# Patient Record
Sex: Female | Born: 1970 | Race: Black or African American | Hispanic: No | Marital: Married | Smoking: Never smoker
Health system: Southern US, Community
[De-identification: ages and names within clinical notes are randomized; demographics above are authoritative.]

## PROBLEM LIST (undated history)

## (undated) DIAGNOSIS — Z789 Other specified health status: Secondary | ICD-10-CM

## (undated) DIAGNOSIS — D649 Anemia, unspecified: Secondary | ICD-10-CM

## (undated) DIAGNOSIS — E669 Obesity, unspecified: Secondary | ICD-10-CM

## (undated) DIAGNOSIS — E119 Type 2 diabetes mellitus without complications: Secondary | ICD-10-CM

## (undated) DIAGNOSIS — E785 Hyperlipidemia, unspecified: Secondary | ICD-10-CM

## (undated) HISTORY — DX: Type 2 diabetes mellitus without complications: E11.9

## (undated) HISTORY — DX: Obesity, unspecified: E66.9

## (undated) HISTORY — DX: Hyperlipidemia, unspecified: E78.5

## (undated) HISTORY — PX: BREAST CYST ASPIRATION: SHX578

---

## 2009-11-12 ENCOUNTER — Emergency Department: Payer: Self-pay | Admitting: Emergency Medicine

## 2010-02-08 ENCOUNTER — Ambulatory Visit: Payer: Self-pay | Admitting: Family Medicine

## 2010-02-08 LAB — HM MAMMOGRAPHY: HM MAMMO: NORMAL

## 2010-02-10 ENCOUNTER — Ambulatory Visit: Payer: Self-pay | Admitting: Family Medicine

## 2010-05-29 DIAGNOSIS — D649 Anemia, unspecified: Secondary | ICD-10-CM

## 2010-05-29 HISTORY — DX: Anemia, unspecified: D64.9

## 2010-09-07 ENCOUNTER — Ambulatory Visit: Payer: Self-pay | Admitting: General Surgery

## 2011-04-05 ENCOUNTER — Ambulatory Visit: Payer: Self-pay | Admitting: General Surgery

## 2011-04-14 ENCOUNTER — Ambulatory Visit: Payer: Self-pay | Admitting: General Surgery

## 2011-11-13 ENCOUNTER — Ambulatory Visit: Payer: Self-pay | Admitting: General Surgery

## 2011-12-26 LAB — HM PAP SMEAR: HM Pap smear: NORMAL

## 2013-05-19 ENCOUNTER — Encounter (HOSPITAL_COMMUNITY): Payer: Self-pay | Admitting: Pharmacist

## 2013-05-26 ENCOUNTER — Encounter (HOSPITAL_COMMUNITY): Payer: Self-pay | Admitting: Pharmacist

## 2013-06-02 ENCOUNTER — Other Ambulatory Visit: Payer: Self-pay | Admitting: Obstetrics and Gynecology

## 2013-06-03 NOTE — H&P (Signed)
NAMEELLARY, CASAMENTO NO.:  [id number]  MEDICAL RECORD NO.:  [id number]  LOCATION:  [facility]                         FACILITY:  [facility]  PHYSICIAN:  Lovenia Kim, M.D.DATE OF BIRTH:  June 03, 1970  DATE OF ADMISSION:  06/04/2013 DATE OF DISCHARGE:                             HISTORY & PHYSICAL   CHIEF COMPLAINT:  Menometrorrhagia.  HISTORY OF PRESENT ILLNESS:  She is a 43 year old African American female, G1, P1, with menometrorrhagia refractory anemia, for definitive therapy.  ALLERGIES:  She has no known drug allergies.  MEDICATIONS:  Iron and multivitamins.  SOCIAL HISTORY:  Nonsmoker, nondrinker.  Denies domestic or physical violence.  PAST SURGICAL HISTORY:  History of vaginal delivery x1, history of breast cyst aspiration.  FAMILY HISTORY:  Colon cancer, hypertension, COPD, and diabetes.  PHYSICAL EXAMINATION:  GENERAL:  Well-developed, well-nourished female in no acute distress. HEENT:  Normal. NECK:  Supple.  Full range of motion. LUNGS:  Clear. ABDOMEN:  Soft, nontender. PELVIC:  Reveals uterus to be bulky and retroflexed.  No adnexal masses. EXTREMITIES:  There are no cords. NEUROLOGIC:  Nonfocal. SKIN:  Intact.  IMPRESSION:  Symptomatic menometrorrhagia.  PLAN:  Proceed with diagnostic hysteroscopy, D and C, __________ NovaSure, risks of anesthesia, infection, bleeding, with injury to surrounding organs discussed, delayed versus immediate complications to include bowel and bladder injury noted.  The patient acknowledges and wishes to proceed.     Lovenia Kim, M.D.     RJT/MEDQ  D:  06/03/2013  T:  06/03/2013  Job:  [id number]

## 2013-06-04 ENCOUNTER — Encounter (HOSPITAL_COMMUNITY)
Admission: RE | Disposition: A | Discharge status: Home / Self Care | Payer: Self-pay | Source: Ambulatory Visit | Attending: Obstetrics and Gynecology

## 2013-06-04 ENCOUNTER — Encounter (HOSPITAL_COMMUNITY): Payer: Self-pay | Admitting: Anesthesiology

## 2013-06-04 ENCOUNTER — Encounter (HOSPITAL_COMMUNITY): Payer: BC Managed Care – PPO | Admitting: Anesthesiology

## 2013-06-04 ENCOUNTER — Ambulatory Visit (HOSPITAL_COMMUNITY)
Admission: RE | Admit: 2013-06-04 | Discharge: 2013-06-04 | Disposition: A | Discharge status: Home / Self Care | Payer: BC Managed Care – PPO | Source: Ambulatory Visit | Attending: Obstetrics and Gynecology | Admitting: Obstetrics and Gynecology

## 2013-06-04 ENCOUNTER — Ambulatory Visit (HOSPITAL_COMMUNITY): Payer: BC Managed Care – PPO | Admitting: Anesthesiology

## 2013-06-04 DIAGNOSIS — D649 Anemia, unspecified: Secondary | ICD-10-CM | POA: Insufficient documentation

## 2013-06-04 DIAGNOSIS — N840 Polyp of corpus uteri: Secondary | ICD-10-CM | POA: Insufficient documentation

## 2013-06-04 DIAGNOSIS — N920 Excessive and frequent menstruation with regular cycle: Secondary | ICD-10-CM | POA: Insufficient documentation

## 2013-06-04 DIAGNOSIS — D250 Submucous leiomyoma of uterus: Secondary | ICD-10-CM | POA: Insufficient documentation

## 2013-06-04 HISTORY — DX: Other specified health status: Z78.9

## 2013-06-04 HISTORY — DX: Anemia, unspecified: D64.9

## 2013-06-04 HISTORY — PX: DILITATION & CURRETTAGE/HYSTROSCOPY WITH NOVASURE ABLATION: SHX5568

## 2013-06-04 LAB — CBC
HEMATOCRIT: 27.4 % — AB (ref 36.0–46.0)
HEMOGLOBIN: 8.5 g/dL — AB (ref 12.0–15.0)
MCH: 20.2 pg — ABNORMAL LOW (ref 26.0–34.0)
MCHC: 31 g/dL (ref 30.0–36.0)
MCV: 65.1 fL — ABNORMAL LOW (ref 78.0–100.0)
Platelets: 382 10*3/uL (ref 150–400)
RBC: 4.21 MIL/uL (ref 3.87–5.11)
RDW: 14.3 % (ref 11.5–15.5)
WBC: 6.6 10*3/uL (ref 4.0–10.5)

## 2013-06-04 LAB — HCG, SERUM, QUALITATIVE: Preg, Serum: NEGATIVE

## 2013-06-04 SURGERY — DILATATION & CURETTAGE/HYSTEROSCOPY WITH NOVASURE ABLATION
Anesthesia: General | Site: Cervix

## 2013-06-04 MED ORDER — FENTANYL CITRATE 0.05 MG/ML IJ SOLN
INTRAMUSCULAR | Status: AC
  Filled 2013-06-04: qty 2

## 2013-06-04 MED ORDER — DEXAMETHASONE SODIUM PHOSPHATE 10 MG/ML IJ SOLN
INTRAMUSCULAR | Status: AC
  Filled 2013-06-04: qty 1

## 2013-06-04 MED ORDER — LIDOCAINE HCL (CARDIAC) 20 MG/ML IV SOLN
INTRAVENOUS | Status: DC | PRN
  Administered 2013-06-04: 50 mg via INTRAVENOUS

## 2013-06-04 MED ORDER — OXYCODONE-ACETAMINOPHEN 5-325 MG PO TABS: 1.0000 | ORAL_TABLET | ORAL | Status: DC | PRN

## 2013-06-04 MED ORDER — VASOPRESSIN 20 UNIT/ML IJ SOLN
INTRAMUSCULAR | Status: DC | PRN
  Administered 2013-06-04: 20 [IU]

## 2013-06-04 MED ORDER — ONDANSETRON HCL 4 MG/2ML IJ SOLN: 4.0000 mg | Freq: Once | INTRAMUSCULAR | Status: DC | PRN

## 2013-06-04 MED ORDER — KETOROLAC TROMETHAMINE 30 MG/ML IJ SOLN
INTRAMUSCULAR | Status: DC | PRN
  Administered 2013-06-04: 30 mg via INTRAVENOUS

## 2013-06-04 MED ORDER — MIDAZOLAM HCL 5 MG/5ML IJ SOLN
INTRAMUSCULAR | Status: DC | PRN
  Administered 2013-06-04: 2 mg via INTRAVENOUS

## 2013-06-04 MED ORDER — ONDANSETRON HCL 4 MG/2ML IJ SOLN
INTRAMUSCULAR | Status: AC
  Filled 2013-06-04: qty 2

## 2013-06-04 MED ORDER — ONDANSETRON HCL 4 MG/2ML IJ SOLN
INTRAMUSCULAR | Status: DC | PRN
  Administered 2013-06-04: 4 mg via INTRAVENOUS

## 2013-06-04 MED ORDER — BUPIVACAINE HCL (PF) 0.25 % IJ SOLN
INTRAMUSCULAR | Status: AC
  Filled 2013-06-04: qty 30

## 2013-06-04 MED ORDER — CEFAZOLIN SODIUM-DEXTROSE 2-3 GM-% IV SOLR
2.0000 g | INTRAVENOUS | Status: AC
Start: 2013-06-04 — End: 2013-06-04
  Administered 2013-06-04: 2 g via INTRAVENOUS

## 2013-06-04 MED ORDER — LIDOCAINE HCL (CARDIAC) 20 MG/ML IV SOLN
INTRAVENOUS | Status: AC
  Filled 2013-06-04: qty 5

## 2013-06-04 MED ORDER — FENTANYL CITRATE 0.05 MG/ML IJ SOLN
25.0000 ug | INTRAMUSCULAR | Status: DC | PRN
Start: 2013-06-04 — End: 2013-06-04

## 2013-06-04 MED ORDER — LACTATED RINGERS IV SOLN
INTRAVENOUS | Status: DC
  Administered 2013-06-04 (×2): via INTRAVENOUS

## 2013-06-04 MED ORDER — PROPOFOL 10 MG/ML IV EMUL
INTRAVENOUS | Status: AC
  Filled 2013-06-04: qty 20

## 2013-06-04 MED ORDER — DEXAMETHASONE SODIUM PHOSPHATE 10 MG/ML IJ SOLN
INTRAMUSCULAR | Status: DC | PRN
  Administered 2013-06-04: 10 mg via INTRAVENOUS

## 2013-06-04 MED ORDER — FENTANYL CITRATE 0.05 MG/ML IJ SOLN
INTRAMUSCULAR | Status: DC | PRN
  Administered 2013-06-04: 100 ug via INTRAVENOUS

## 2013-06-04 MED ORDER — SODIUM CHLORIDE 0.9 % IR SOLN
Status: DC | PRN
  Administered 2013-06-04: 1000 mL

## 2013-06-04 MED ORDER — KETOROLAC TROMETHAMINE 30 MG/ML IJ SOLN: 15.0000 mg | Freq: Once | INTRAMUSCULAR | Status: DC | PRN

## 2013-06-04 MED ORDER — GLYCINE 1.5 % IR SOLN
Status: DC | PRN
  Administered 2013-06-04: 3000 mL

## 2013-06-04 MED ORDER — BUPIVACAINE HCL (PF) 0.25 % IJ SOLN
INTRAMUSCULAR | Status: DC | PRN
  Administered 2013-06-04: 20 mL
  Administered 2013-06-04: 17 mL

## 2013-06-04 MED ORDER — MIDAZOLAM HCL 2 MG/2ML IJ SOLN
INTRAMUSCULAR | Status: AC
  Filled 2013-06-04: qty 2

## 2013-06-04 MED ORDER — KETOROLAC TROMETHAMINE 30 MG/ML IJ SOLN
INTRAMUSCULAR | Status: AC
  Filled 2013-06-04: qty 1

## 2013-06-04 MED ORDER — VASOPRESSIN 20 UNIT/ML IJ SOLN
INTRAMUSCULAR | Status: AC
  Filled 2013-06-04: qty 1

## 2013-06-04 MED ORDER — PROPOFOL 10 MG/ML IV BOLUS
INTRAVENOUS | Status: DC | PRN
  Administered 2013-06-04: 200 mg via INTRAVENOUS

## 2013-06-04 MED ORDER — SODIUM CHLORIDE 0.9 % IJ SOLN
INTRAMUSCULAR | Status: AC
  Filled 2013-06-04: qty 50

## 2013-06-04 MED ORDER — MEPERIDINE HCL 25 MG/ML IJ SOLN: 6.2500 mg | INTRAMUSCULAR | Status: DC | PRN

## 2013-06-04 SURGICAL SUPPLY — 15 items
ABLATOR ENDOMETRIAL BIPOLAR (ABLATOR) ×2 IMPLANT
CATH ROBINSON RED A/P 16FR (CATHETERS) ×2 IMPLANT
CLOTH BEACON ORANGE TIMEOUT ST (SAFETY) ×2 IMPLANT
CONTAINER PREFILL 10% NBF 60ML (FORM) ×4 IMPLANT
DRSG TELFA 3X8 NADH (GAUZE/BANDAGES/DRESSINGS) ×2 IMPLANT
ELECTRODE RT ANGLE VERSAPOINT (CUTTING LOOP) IMPLANT
GLOVE BIO SURGEON STRL SZ7.5 (GLOVE) ×2 IMPLANT
GOWN STRL REIN XL XLG (GOWN DISPOSABLE) ×4 IMPLANT
LOOP ANGLED CUTTING 22FR (CUTTING LOOP) IMPLANT
PACK HYSTEROSCOPY LF (CUSTOM PROCEDURE TRAY) ×2 IMPLANT
PAD OB MATERNITY 4.3X12.25 (PERSONAL CARE ITEMS) ×2 IMPLANT
PAD PREP 24X48 CUFFED NSTRL (MISCELLANEOUS) ×2 IMPLANT
SYR TB 1ML 25GX5/8 (SYRINGE) ×2 IMPLANT
TOWEL OR 17X24 6PK STRL BLUE (TOWEL DISPOSABLE) ×4 IMPLANT
WATER STERILE IRR 1000ML POUR (IV SOLUTION) ×2 IMPLANT

## 2013-06-04 NOTE — Discharge Instructions (Addendum)
DISCHARGE INSTRUCTIONS: D&C / D&E The following instructions have been prepared to help you care for yourself upon your return home.   Personal hygiene:  Use sanitary pads for vaginal drainage, not tampons.  Shower the day after your procedure.  NO tub baths, pools or Jacuzzis for 2-3 weeks.  Wipe front to back after using the bathroom.  Activity and limitations:  Do NOT drive or operate any equipment for 24 hours. The effects of anesthesia are still present and drowsiness may result.  Do NOT rest in bed all day.  Walking is encouraged.  Walk up and down stairs slowly.  You may resume your normal activity in one to two days or as indicated by your physician.  Sexual activity: NO intercourse for at least 2 weeks after the procedure, or as indicated by your physician.  Diet: Eat a light meal as desired this evening. You may resume your usual diet tomorrow.  Return to work: You may resume your work activities in one to two days or as indicated by your doctor.  What to expect after your surgery: Expect to have vaginal bleeding/discharge for 2-3 days and spotting for up to 10 days. It is not unusual to have soreness for up to 1-2 weeks. You may have a slight burning sensation when you urinate for the first day. Mild cramps may continue for a couple of days. You may have a regular period in 2-6 weeks.  Call your doctor for any of the following:  Excessive vaginal bleeding, saturating and changing one pad every hour.  Inability to urinate 6 hours after discharge from hospital.  Pain not relieved by pain medication.  Fever of 100.4 F or greater.  Unusual vaginal discharge or odor.   Call for an appointment:    Patients signature: ______________________  Nurses signature ________________________  Support person's signature_______________________    DO NOT TAKE IBUPROFEN PRODUCTS UNTIL 4:30 PM TODAY.  YOU RECEIVED A SIMILAR MEDICATION IN THE OPERATING ROOM TODAY.

## 2013-06-04 NOTE — Progress Notes (Signed)
Patient ID: Brittney Higgins, female   DOB: 01-Dec-1970, 43 y.o.   MRN: [id number] Patient seen and examined. Consent witnessed and signed. No changes noted. Update completed. CBC    Component Value Date/Time   WBC 6.6 06/04/2013 0850   RBC 4.21 06/04/2013 0850   HGB 8.5* 06/04/2013 0850   HCT 27.4* 06/04/2013 0850   PLT 382 06/04/2013 0850   MCV 65.1* 06/04/2013 0850   MCH 20.2* 06/04/2013 0850   MCHC 31.0 06/04/2013 0850   RDW 14.3 06/04/2013 0850

## 2013-06-04 NOTE — Transfer of Care (Signed)
Immediate Anesthesia Transfer of Care Note  Patient: Brittney Higgins  Procedure(s) Performed: Procedure(s): DILATATION & CURETTAGE/HYSTEROSCOPY WITH NOVASURE ABLATION WITH POSSIBLE VERSAPOINT (N/A)  Patient Location: PACU  Anesthesia Type:General  Level of Consciousness: sedated  Airway & Oxygen Therapy: Patient Spontanous Breathing and Patient connected to nasal cannula oxygen  Post-op Assessment: Report given to PACU RN and Post -op Vital signs reviewed and stable  Post vital signs: stable  Complications: No apparent anesthesia complications

## 2013-06-04 NOTE — Anesthesia Preprocedure Evaluation (Signed)
Anesthesia Evaluation  Patient identified by MRN, date of birth, ID band Patient awake    Reviewed: Allergy & Precautions, H&P , NPO status , Patient's Chart, lab work & pertinent test results  Airway Mallampati: I TM Distance: >3 FB Neck ROM: full    Dental no notable dental hx. (+) Teeth Intact   Pulmonary neg pulmonary ROS,    Pulmonary exam normal       Cardiovascular negative cardio ROS      Neuro/Psych negative neurological ROS  negative psych ROS   GI/Hepatic negative GI ROS, Neg liver ROS,   Endo/Other  negative endocrine ROS  Renal/GU negative Renal ROS     Musculoskeletal negative musculoskeletal ROS (+)   Abdominal Normal abdominal exam  (+)   Peds negative pediatric ROS (+)  Hematology negative hematology ROS (+)   Anesthesia Other Findings   Reproductive/Obstetrics negative OB ROS                           Anesthesia Physical Anesthesia Plan  ASA: I  Anesthesia Plan: General   Post-op Pain Management:    Induction: Intravenous  Airway Management Planned: LMA  Additional Equipment:   Intra-op Plan:   Post-operative Plan:   Informed Consent: I have reviewed the patients History and Physical, chart, labs and discussed the procedure including the risks, benefits and alternatives for the proposed anesthesia with the patient or authorized representative who has indicated his/her understanding and acceptance.     Plan Discussed with: CRNA  Anesthesia Plan Comments:         Anesthesia Quick Evaluation

## 2013-06-04 NOTE — Anesthesia Postprocedure Evaluation (Signed)
Anesthesia Post Note  Patient: Brittney Higgins  Procedure(s) Performed: Procedure(s) (LRB): DILATATION & CURETTAGE/HYSTEROSCOPY WITH NOVASURE ABLATION WITH ATTEMPTED VERSAPOINT (N/A)  Anesthesia type: General  Patient location: PACU  Post pain: Pain level controlled  Post assessment: Post-op Vital signs reviewed  Last Vitals:  Filed Vitals:   06/04/13 1100  BP: 135/77  Pulse: 83  Temp:   Resp: 12    Post vital signs: Reviewed  Level of consciousness: sedated  Complications: No apparent anesthesia complications

## 2013-06-04 NOTE — Op Note (Signed)
06/04/2013  10:23 AM  PATIENT:  Brittney Higgins  43 y.o. female  PRE-OPERATIVE DIAGNOSIS:  Menorrhagia, Fibroids   POST-OPERATIVE DIAGNOSIS:  Menorrhagia, Fibroids , endometrial polyp  PROCEDURE:  Procedure(s): DILATATION & CURETTAGE/HYSTEROSCOPY WITH NOVASURE ABLATION  RESECTION OF ENDOMETRIAL POLYP RESECTION OF SUBMUCOUS FIBROID  SURGEON:  Surgeon(s): Lovenia Kim, MD  ASSISTANTS: none   ANESTHESIA:   local and general  ESTIMATED BLOOD LOSS: * No blood loss amount entered *   DRAINS: none   LOCAL MEDICATIONS USED:  BUPIVICAINE  and Amount: 20 ml  SPECIMEN:  Source of Specimen:  EMC, polyp, fibroid  DISPOSITION OF SPECIMEN:  PATHOLOGY  COUNTS:  YES  DICTATION #: [id number]  PLAN OF CARE: DC home  PATIENT DISPOSITION:  PACU - hemodynamically stable.

## 2013-06-05 ENCOUNTER — Encounter (HOSPITAL_COMMUNITY): Payer: Self-pay | Admitting: Obstetrics and Gynecology

## 2013-06-05 NOTE — Op Note (Signed)
Brittney Higgins, [name] NO.:  [id number]  MEDICAL RECORD NO.:  [id number]  LOCATION:  [facility]                          FACILITY:  [facility]  PHYSICIAN:  Lovenia Kim, M.D.DATE OF BIRTH:  11-09-1970  DATE OF PROCEDURE:  06/04/2013 DATE OF DISCHARGE:                              OPERATIVE REPORT   PREOPERATIVE DIAGNOSIS:  Refractory menometrorrhagia with secondary anemia.  POSTOPERATIVE DIAGNOSIS:  Refractory menometrorrhagia with secondary anemia, endometrial polyp, submucous fibroid.  PROCEDURE:  Diagnostic hysteroscopy, D and C, resection of endometrial polyp, resection of submucous fibroid, NovaSure endometrial ablation.  SURGEON:  Lovenia Kim, M.D.  ASSISTANT:  None.  ANESTHESIA:  General and local.  ESTIMATED BLOOD LOSS:  Less than 50 mL.  FLUID DEFICIT:  150 mL.  COMPLICATIONS:  None.  DRAINS:  None.  COUNTS:  Correct.  DISPOSITION:  The patient went to recovery room in good condition.  FINDINGS:  A right anterior wall fundal submucous fibroid posterior wall endometrial polyp.  DESCRIPTION OF PROCEDURE:  After being apprised of risks of anesthesia, infection, bleeding, injury to surrounding organs, possible need for repair, delayed versus immediate complications to include bowel and bladder injury, possible need for repair, the patient was brought to the operating room where she was administered general anesthetic without complications, prepped and draped in usual sterile fashion.  Feet were placed in [place].  Patient was catheterized until the bladder was empty.  Exam under anesthesia reveals a bulky anteflexed uterus but no adnexal masses appreciated.  Dilute Marcaine solution placed 20 mL standard paracervical block.  Dilute Pitressin solution placed at 3 and 9 o'clock at the cervical vaginal junction.  The cervix was then easily dilated up to a #31 Pratt dilator.  Hysteroscope was placed. Visualization reveals findings as  noted above.  D and C was performed using sharp curettage in a 4-quadrant method with copious amounts of endometrial tissue was sent to Pathology for permanent section.  At this time, the posterior wall endometrial polyp is grasped and removed using polyp forceps.  The resectoscope was used to resect the anterior wall fibroid in multiple passes.  At this time, the cavity is irrigated and the NovaSure device entered seated to a length of 6.5 with 4.6.  CO2 test was performed and is negative.  The procedure was initiated to a wattage of 164 for 60 seconds.  The device was removed, inspected, found to be intact.  Revisualization of the endometrial cavity reveals well ablated endometrial cavity.  No evidence of perforation.  Good hemostasis noted.  The patient tolerated the procedure well.  She was awakened, and transferred to recovery in good condition.     Lovenia Kim, M.D.     RJT/MEDQ  D:  06/04/2013  T:  06/04/2013  Job:  [id number]

## 2013-12-24 LAB — LIPID PANEL
Cholesterol: 169 mg/dL (ref 0–200)
HDL: 39 mg/dL (ref 35–70)
LDL CALC: 107 mg/dL
Triglycerides: 117 mg/dL (ref 40–160)

## 2014-02-09 ENCOUNTER — Ambulatory Visit: Payer: Self-pay | Admitting: Internal Medicine

## 2014-02-09 LAB — IRON AND TIBC
IRON SATURATION: 34 %
Iron Bind.Cap.(Total): 412 ug/dL (ref 250–450)
Iron: 139 ug/dL (ref 50–170)
UNBOUND IRON-BIND. CAP.: 273 ug/dL

## 2014-02-09 LAB — CANCER CENTER HEMOGLOBIN: HGB: 13.7 g/dL (ref 12.0–16.0)

## 2014-02-09 LAB — FERRITIN: FERRITIN (ARMC): 20 ng/mL (ref 8–388)

## 2014-02-16 ENCOUNTER — Telehealth (HOSPITAL_COMMUNITY): Payer: Self-pay | Admitting: *Deleted

## 2014-02-26 ENCOUNTER — Ambulatory Visit: Payer: Self-pay | Admitting: Internal Medicine

## 2014-03-11 ENCOUNTER — Ambulatory Visit: Payer: Self-pay | Admitting: Family Medicine

## 2014-03-27 ENCOUNTER — Ambulatory Visit: Payer: Self-pay | Admitting: Family Medicine

## 2014-12-30 ENCOUNTER — Telehealth: Payer: Self-pay | Admitting: Family Medicine

## 2014-12-30 DIAGNOSIS — E559 Vitamin D deficiency, unspecified: Secondary | ICD-10-CM

## 2014-12-30 DIAGNOSIS — E785 Hyperlipidemia, unspecified: Secondary | ICD-10-CM

## 2014-12-30 DIAGNOSIS — Z862 Personal history of diseases of the blood and blood-forming organs and certain disorders involving the immune mechanism: Secondary | ICD-10-CM

## 2014-12-30 DIAGNOSIS — Z79899 Other long term (current) drug therapy: Secondary | ICD-10-CM

## 2014-12-30 NOTE — Telephone Encounter (Signed)
Patient has an appointment next Friday for CPE and is requesting a lab order so her labs will already be complete.

## 2014-12-31 NOTE — Telephone Encounter (Signed)
Please order labs to be done before physical.

## 2015-01-01 DIAGNOSIS — M21619 Bunion of unspecified foot: Secondary | ICD-10-CM | POA: Insufficient documentation

## 2015-01-01 DIAGNOSIS — K5909 Other constipation: Secondary | ICD-10-CM | POA: Insufficient documentation

## 2015-01-01 DIAGNOSIS — E559 Vitamin D deficiency, unspecified: Secondary | ICD-10-CM | POA: Insufficient documentation

## 2015-01-01 DIAGNOSIS — R7989 Other specified abnormal findings of blood chemistry: Secondary | ICD-10-CM | POA: Insufficient documentation

## 2015-01-01 DIAGNOSIS — Z862 Personal history of diseases of the blood and blood-forming organs and certain disorders involving the immune mechanism: Secondary | ICD-10-CM | POA: Insufficient documentation

## 2015-01-01 DIAGNOSIS — E669 Obesity, unspecified: Secondary | ICD-10-CM | POA: Insufficient documentation

## 2015-01-01 DIAGNOSIS — E785 Hyperlipidemia, unspecified: Secondary | ICD-10-CM | POA: Insufficient documentation

## 2015-01-01 DIAGNOSIS — D219 Benign neoplasm of connective and other soft tissue, unspecified: Secondary | ICD-10-CM | POA: Insufficient documentation

## 2015-01-01 DIAGNOSIS — N926 Irregular menstruation, unspecified: Secondary | ICD-10-CM | POA: Insufficient documentation

## 2015-01-04 NOTE — Telephone Encounter (Signed)
Patient notified and blood work is upfront for patient to pick up.

## 2015-01-06 ENCOUNTER — Other Ambulatory Visit: Payer: Self-pay | Admitting: Family Medicine

## 2015-01-06 LAB — COMPREHENSIVE METABOLIC PANEL
A/G RATIO: 1.5 (ref 1.1–2.5)
ALT: 21 IU/L (ref 0–32)
AST: 25 IU/L (ref 0–40)
Albumin: 4.5 g/dL (ref 3.5–5.5)
Alkaline Phosphatase: 44 IU/L (ref 39–117)
BUN/Creatinine Ratio: 11 (ref 9–23)
BUN: 10 mg/dL (ref 6–24)
Bilirubin Total: 0.5 mg/dL (ref 0.0–1.2)
CO2: 23 mmol/L (ref 18–29)
CREATININE: 0.87 mg/dL (ref 0.57–1.00)
Calcium: 9 mg/dL (ref 8.7–10.2)
Chloride: 102 mmol/L (ref 97–108)
GFR calc Af Amer: 94 mL/min/{1.73_m2} (ref >59)
GFR, EST NON AFRICAN AMERICAN: 81 mL/min/{1.73_m2} (ref >59)
GLUCOSE: 98 mg/dL (ref 65–99)
Globulin, Total: 3.1 g/dL (ref 1.5–4.5)
POTASSIUM: 4.8 mmol/L (ref 3.5–5.2)
Sodium: 142 mmol/L (ref 134–144)
TOTAL PROTEIN: 7.6 g/dL (ref 6.0–8.5)

## 2015-01-06 LAB — CBC WITH DIFFERENTIAL/PLATELET
BASOS ABS: 0 10*3/uL (ref 0.0–0.2)
Basos: 1 %
EOS (ABSOLUTE): 0.2 10*3/uL (ref 0.0–0.4)
Eos: 3 %
HEMATOCRIT: 40.3 % (ref 34.0–46.6)
Hemoglobin: 13 g/dL (ref 11.1–15.9)
IMMATURE GRANULOCYTES: 0 %
Immature Grans (Abs): 0 10*3/uL (ref 0.0–0.1)
LYMPHS ABS: 2.9 10*3/uL (ref 0.7–3.1)
Lymphs: 44 %
MCH: 22.6 pg — AB (ref 26.6–33.0)
MCHC: 32.3 g/dL (ref 31.5–35.7)
MCV: 70 fL — AB (ref 79–97)
MONOCYTES: 7 %
Monocytes Absolute: 0.5 10*3/uL (ref 0.1–0.9)
NEUTROS ABS: 3.1 10*3/uL (ref 1.4–7.0)
Neutrophils: 45 %
Platelets: 271 10*3/uL (ref 150–379)
RBC: 5.75 x10E6/uL — ABNORMAL HIGH (ref 3.77–5.28)
RDW: 14.6 % (ref 12.3–15.4)
WBC: 6.7 10*3/uL (ref 3.4–10.8)

## 2015-01-06 LAB — LIPID PANEL
CHOL/HDL RATIO: 4.7 ratio — AB (ref 0.0–4.4)
Cholesterol, Total: 179 mg/dL (ref 100–199)
HDL: 38 mg/dL — AB (ref >39)
LDL Calculated: 108 mg/dL — ABNORMAL HIGH (ref 0–99)
Triglycerides: 165 mg/dL — ABNORMAL HIGH (ref 0–149)
VLDL CHOLESTEROL CAL: 33 mg/dL (ref 5–40)

## 2015-01-06 LAB — VITAMIN D 25 HYDROXY (VIT D DEFICIENCY, FRACTURES): Vit D, 25-Hydroxy: 18.5 ng/mL — ABNORMAL LOW (ref 30.0–100.0)

## 2015-01-06 LAB — FERRITIN: Ferritin: 51 ng/mL (ref 15–150)

## 2015-01-06 MED ORDER — VITAMIN D (ERGOCALCIFEROL) 1.25 MG (50000 UNIT) PO CAPS: 50000.0000 [IU] | ORAL_CAPSULE | ORAL | Status: DC

## 2015-01-08 ENCOUNTER — Ambulatory Visit (INDEPENDENT_AMBULATORY_CARE_PROVIDER_SITE_OTHER): Payer: BC Managed Care – PPO | Admitting: Family Medicine

## 2015-01-08 ENCOUNTER — Encounter: Payer: Self-pay | Admitting: Family Medicine

## 2015-01-08 VITALS — BP 132/84 | HR 74 | Temp 97.9°F | Resp 16 | Ht 64.0 in | Wt 183.0 lb

## 2015-01-08 DIAGNOSIS — Z124 Encounter for screening for malignant neoplasm of cervix: Secondary | ICD-10-CM | POA: Diagnosis not present

## 2015-01-08 DIAGNOSIS — Z23 Encounter for immunization: Secondary | ICD-10-CM

## 2015-01-08 DIAGNOSIS — K5900 Constipation, unspecified: Secondary | ICD-10-CM

## 2015-01-08 DIAGNOSIS — N6320 Unspecified lump in the left breast, unspecified quadrant: Secondary | ICD-10-CM

## 2015-01-08 DIAGNOSIS — L219 Seborrheic dermatitis, unspecified: Secondary | ICD-10-CM | POA: Diagnosis not present

## 2015-01-08 DIAGNOSIS — Z1239 Encounter for other screening for malignant neoplasm of breast: Secondary | ICD-10-CM

## 2015-01-08 DIAGNOSIS — E8881 Metabolic syndrome: Secondary | ICD-10-CM | POA: Diagnosis not present

## 2015-01-08 DIAGNOSIS — D1720 Benign lipomatous neoplasm of skin and subcutaneous tissue of unspecified limb: Secondary | ICD-10-CM | POA: Diagnosis not present

## 2015-01-08 DIAGNOSIS — K5909 Other constipation: Secondary | ICD-10-CM

## 2015-01-08 DIAGNOSIS — N63 Unspecified lump in breast: Secondary | ICD-10-CM

## 2015-01-08 DIAGNOSIS — Z0000 Encounter for general adult medical examination without abnormal findings: Secondary | ICD-10-CM

## 2015-01-08 DIAGNOSIS — Z01419 Encounter for gynecological examination (general) (routine) without abnormal findings: Secondary | ICD-10-CM

## 2015-01-08 MED ORDER — SELENIUM SULFIDE 2.25 % EX SHAM: 1.0000 | MEDICATED_SHAMPOO | CUTANEOUS | Status: DC

## 2015-01-08 MED ORDER — POLYETHYLENE GLYCOL 3350 17 GM/SCOOP PO POWD: 17.0000 g | Freq: Every day | ORAL | Status: DC

## 2015-01-08 NOTE — Progress Notes (Signed)
Name: Brittney Higgins   MRN: [id number]    DOB: 05/17/1971   Date:01/08/2015       Progress Note  Subjective  Chief Complaint  Chief Complaint  Patient presents with  . Annual Exam    HPI  Well woman : patient has been feeling well, no cycles since January 2015 because of endometrial ablation for DUB.  She denies hot flashes and night sweats. She has noticed a flare of seborrheic dermatitis of scalp and would like a refill of her medication/shampoo.    Patient Active Problem List   Diagnosis Date Noted  . Seborrheic dermatitis of scalp 01/08/2015  . History of anemia 01/01/2015  . HLD (hyperlipidemia) 01/01/2015  . Irregular bleeding 01/01/2015  . Obesity (BMI 30.0-34.9) 01/01/2015  . Vitamin D deficiency 01/01/2015  . Chronic constipation 01/01/2015  . Fibroid 01/01/2015  . Bunion 01/01/2015  . Abnormal CBC 01/01/2015    Past Surgical History  Procedure Laterality Date  . No past surgeries    . Dilitation & currettage/hystroscopy with novasure ablation N/A 06/04/2013    Procedure: DILATATION & CURETTAGE/HYSTEROSCOPY WITH NOVASURE ABLATION WITH ATTEMPTED VERSAPOINT;  Surgeon: Lenoard Aden, MD;  Location: [facility];  Service: Gynecology;  Laterality: N/A;    Family History  Problem Relation Age of Onset  . Hypertension Mother   . COPD Mother   . Colon polyps Father   . Heart disease Father   . CAD Father   . AAA (abdominal aortic aneurysm) Father   . Diabetes Father   . Hypertension Sister   . Diabetes Sister   . Sleep apnea Sister   . Asthma Son     Social History   Social History  . Marital Status: Married    Spouse Name: N/A  . Number of Children: N/A  . Years of Education: N/A   Occupational History  . Not on file.   Social History Main Topics  . Smoking status: Never Smoker   . Smokeless tobacco: Never Used  . Alcohol Use: No  . Drug Use: No  . Sexual Activity: Yes    Birth Control/ Protection: None   Other Topics Concern  . Not on file   Social  History Narrative     Current outpatient prescriptions:  Marland Kitchen  Vitamin D, Ergocalciferol, (DRISDOL) 50000 UNITS CAPS capsule, Take 1 capsule (50,000 Units total) by mouth every 14 (fourteen) days., Disp: 12 capsule, Rfl: 0 .  polyethylene glycol powder (GLYCOLAX/MIRALAX) powder, Take 17 g by mouth daily., Disp: 3350 g, Rfl: 1 .  Selenium Sulfide 2.25 % SHAM, Apply 1 Dose topically 2 (two) times a week., Disp: 180 mL, Rfl: 0  No Known Allergies   ROS  Constitutional: Negative for fever or weight change.  Respiratory: Negative for cough mild  shortness of breath since she gained weight and goes quickly up the steps.   Cardiovascular: Negative for chest pain or palpitations.  Gastrointestinal: Negative for abdominal pain, no bowel changes.  Musculoskeletal: Negative for gait problem or joint swelling.  Skin: positive  for rash on scalp.  Neurological: Negative for dizziness or headache.  No other specific complaints in a complete review of systems (except as listed in HPI above).  Objective  Filed Vitals:   01/08/15 1131  BP: 132/84  Pulse: 74  Temp: 97.9 F (36.6 C)  TempSrc: Oral  Resp: 16  Height: 5\' 4"  (1.626 m)  Weight: 183 lb (83.008 kg)  SpO2: 99%    Body mass index is 31.4 kg/(m^2).  Physical Exam  Constitutional: Patient appears well-developed and well-nourished. No distress.  HENT: Head: Normocephalic and atraumatic. Ears: B TMs ok, no erythema or effusion; Nose: Nose normal. Mouth/Throat: Oropharynx is clear and moist. No oropharyngeal exudate.  Eyes: Conjunctivae and EOM are normal. Pupils are equal, round, and reactive to light. No scleral icterus.  Neck: Normal range of motion. Neck supple. No JVD present. No thyromegaly present.  Cardiovascular: Normal rate, regular rhythm and normal heart sounds.  No murmur heard. No BLE edema. Pulmonary/Chest: Effort normal and breath sounds normal. No respiratory distress. Abdominal: Soft. Bowel sounds are normal, no  distension. There is no tenderness. no masses Breast: left breast mass, smooth, large on Left upper inner quadrant, no nipple discharge or rashes FEMALE GENITALIA:  External genitalia normal External urethra normal Vaginal vault normal without discharge or lesions Cervix normal without discharge or lesions Bimanual exam normal without masses RECTAL: no rectal masses or hemorrhoids Musculoskeletal: Normal range of motion, no joint effusions. No gross deformities Small on right forearm Neurological: he is alert and oriented to person, place, and time. No cranial nerve deficit. Coordination, balance, strength, speech and gait are normal.  Skin: Skin is warm and dry. Rash noted on scalp, dry a scaly . No erythema. She has a lipoma on right biceps area and another one on left posterior thigh Psychiatric: Patient has a normal mood and affect. behavior is normal. Judgment and thought content normal.  Recent Results (from the past 2160 hour(s))  CBC with Differential     Status: Abnormal   Collection Time: 01/05/15 11:17 AM  Result Value Ref Range   WBC 6.7 3.4 - 10.8 x10E3/uL   RBC 5.75 (H) 3.77 - 5.28 x10E6/uL   Hemoglobin 13.0 11.1 - 15.9 g/dL   Hematocrit 95.6 21.3 - 46.6 %   MCV 70 (L) 79 - 97 fL   MCH 22.6 (L) 26.6 - 33.0 pg   MCHC 32.3 31.5 - 35.7 g/dL   RDW 08.6 57.8 - 46.9 %   Platelets 271 150 - 379 x10E3/uL   Neutrophils 45 %   Lymphs 44 %   Monocytes 7 %   Eos 3 %   Basos 1 %   Neutrophils Absolute 3.1 1.4 - 7.0 x10E3/uL   Lymphocytes Absolute 2.9 0.7 - 3.1 x10E3/uL   Monocytes Absolute 0.5 0.1 - 0.9 x10E3/uL   EOS (ABSOLUTE) 0.2 0.0 - 0.4 x10E3/uL   Basophils Absolute 0.0 0.0 - 0.2 x10E3/uL   Immature Granulocytes 0 %   Immature Grans (Abs) 0.0 0.0 - 0.1 x10E3/uL  Comprehensive Metabolic Panel (CMET)     Status: None   Collection Time: 01/05/15 11:17 AM  Result Value Ref Range   Glucose 98 65 - 99 mg/dL   BUN 10 6 - 24 mg/dL   Creatinine, Ser 6.29 0.57 - 1.00 mg/dL    GFR calc non Af Amer 81 >59 mL/min/1.73   GFR calc Af Amer 94 >59 mL/min/1.73   BUN/Creatinine Ratio 11 9 - 23   Sodium 142 134 - 144 mmol/L   Potassium 4.8 3.5 - 5.2 mmol/L   Chloride 102 97 - 108 mmol/L   CO2 23 18 - 29 mmol/L   Calcium 9.0 8.7 - 10.2 mg/dL   Total Protein 7.6 6.0 - 8.5 g/dL   Albumin 4.5 3.5 - 5.5 g/dL   Globulin, Total 3.1 1.5 - 4.5 g/dL   Albumin/Globulin Ratio 1.5 1.1 - 2.5   Bilirubin Total 0.5 0.0 - 1.2 mg/dL   Alkaline Phosphatase  44 39 - 117 IU/L   AST 25 0 - 40 IU/L   ALT 21 0 - 32 IU/L  Lipid Profile     Status: Abnormal   Collection Time: 01/05/15 11:17 AM  Result Value Ref Range   Cholesterol, Total 179 100 - 199 mg/dL   Triglycerides 782 (H) 0 - 149 mg/dL   HDL 38 (L) >95 mg/dL    Comment: According to ATP-III Guidelines, HDL-C >59 mg/dL is considered a negative risk factor for CHD.    VLDL Cholesterol Cal 33 5 - 40 mg/dL   LDL Calculated 621 (H) 0 - 99 mg/dL   Chol/HDL Ratio 4.7 (H) 0.0 - 4.4 ratio units    Comment:                                   T. Chol/HDL Ratio                                             Men  Women                               1/2 Avg.Risk  3.4    3.3                                   Avg.Risk  5.0    4.4                                2X Avg.Risk  9.6    7.1                                3X Avg.Risk 23.4   11.0   Vitamin D (25 hydroxy)     Status: Abnormal   Collection Time: 01/05/15 11:17 AM  Result Value Ref Range   Vit D, 25-Hydroxy 18.5 (L) 30.0 - 100.0 ng/mL    Comment: Vitamin D deficiency has been defined by the Institute of Medicine and an Endocrine Society practice guideline as a level of serum 25-OH vitamin D less than 20 ng/mL (1,2). The Endocrine Society went on to further define vitamin D insufficiency as a level between 21 and 29 ng/mL (2). 1. IOM (Institute of Medicine). 2010. Dietary reference    intakes for calcium and D. [place]: The    [employer]. 2. Holick MF, Binkley Brinsmade,  Bischoff-Ferrari HA, et al.    Evaluation, treatment, and prevention of vitamin D    deficiency: an Endocrine Society clinical practice    guideline. JCEM. 2011 Jul; 96(7):1911-30.   Ferritin     Status: None   Collection Time: 01/05/15 11:17 AM  Result Value Ref Range   Ferritin 51 15 - 150 ng/mL    PHQ2/9: Depression screen PHQ 2/9 01/08/2015  Decreased Interest 0  Down, Depressed, Hopeless 0  PHQ - 2 Score 0    Fall Risk: Fall Risk  01/08/2015  Falls in the past year? No     Assessment & Plan  1. Well woman exam Discussed importance of 150 minutes of physical  activity weekly, eat two servings of fish weekly, eat one serving of tree nuts ( cashews, pistachios, pecans, almonds.Marland Kitchen) every other day, eat 6 servings of fruit/vegetables daily and drink plenty of water and avoid sweet beverages.   2. Cervical cancer screening  - PapLb, HPV, rfx16/18  3. Breast cancer screening  - MM Digital Screening; Future  4. Seborrheic dermatitis  - Selenium Sulfide 2.25 % SHAM; Apply 1 Dose topically 2 (two) times a week.  Dispense: 180 mL; Refill: 0  6. Constipation, chronic  - polyethylene glycol powder (GLYCOLAX/MIRALAX) powder; Take 17 g by mouth daily.  Dispense: 3350 g; Refill: 1  9. Breast mass, left  - MM Digital Diagnostic Unilat L; Future

## 2015-01-12 LAB — PAPLB, HPV, RFX16/18
HPV, HIGH-RISK: NEGATIVE
PAP Smear Comment: 0

## 2015-01-13 NOTE — Progress Notes (Signed)
Patient notified

## 2015-02-09 ENCOUNTER — Telehealth: Payer: Self-pay | Admitting: Family Medicine

## 2015-02-09 ENCOUNTER — Other Ambulatory Visit: Payer: Self-pay

## 2015-02-09 MED ORDER — VITAMIN D (ERGOCALCIFEROL) 1.25 MG (50000 UNIT) PO CAPS
50000.0000 [IU] | ORAL_CAPSULE | ORAL | Status: DC
Start: 2015-02-09 — End: 2015-05-09

## 2015-02-09 NOTE — Telephone Encounter (Signed)
PT IS NEEDING REFILL ON HER VIT D. SHE ACCIDENTALLY PLACED THEM IN ANOTHER BOTTLE AND THAT BOTTLE GOT THROWN AWAY. PHARM IS [facility] ON [address]

## 2015-02-09 NOTE — Telephone Encounter (Signed)
Needs refill accidentally through away?

## 2015-05-09 ENCOUNTER — Other Ambulatory Visit: Payer: Self-pay | Admitting: Family Medicine

## 2015-06-10 IMAGING — MG MM ADDITIONAL VIEWS AT NO CHARGE
5 series · 5 of 5 positions shown · non-contrast
Comparison: 03/11/2014, additional prior mammograms dating back to
02/08/2010

CLINICAL DATA: 43-year-old female, callback from screening
mammogram for possible right breast asymmetry

EXAM:
DIGITAL DIAGNOSTIC  RIGHT MAMMOGRAM WITH CAD
ULTRASOUND RIGHT BREAST

[R CC (1 of 4)]
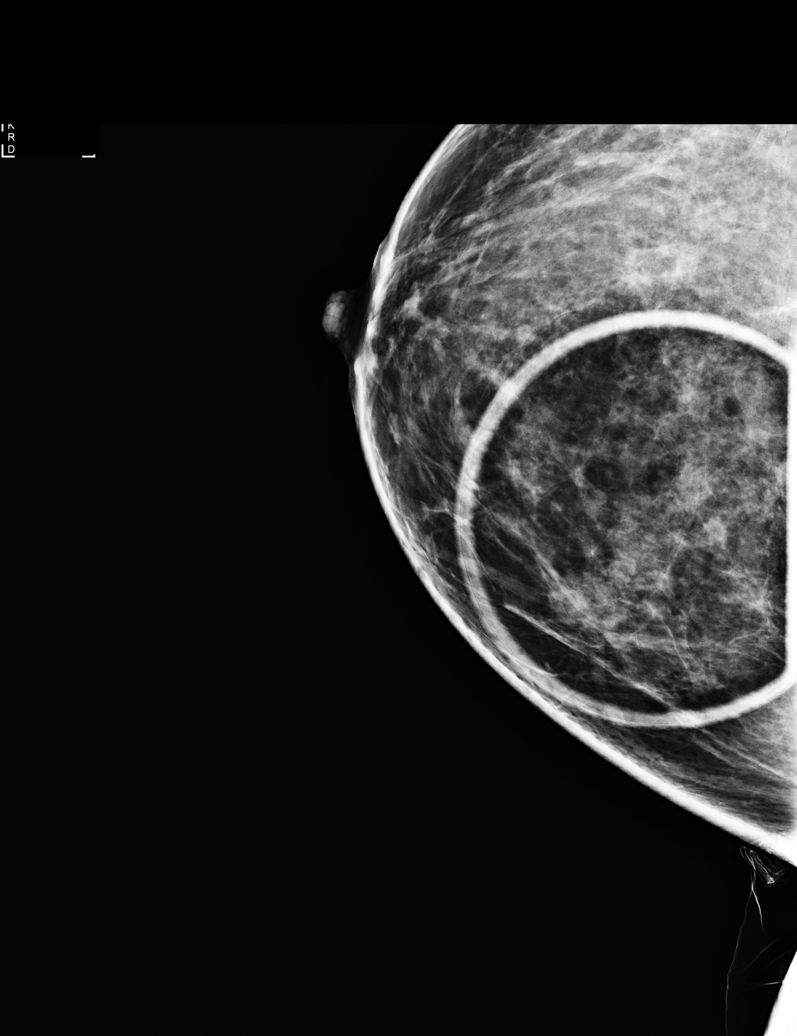

[R ML]
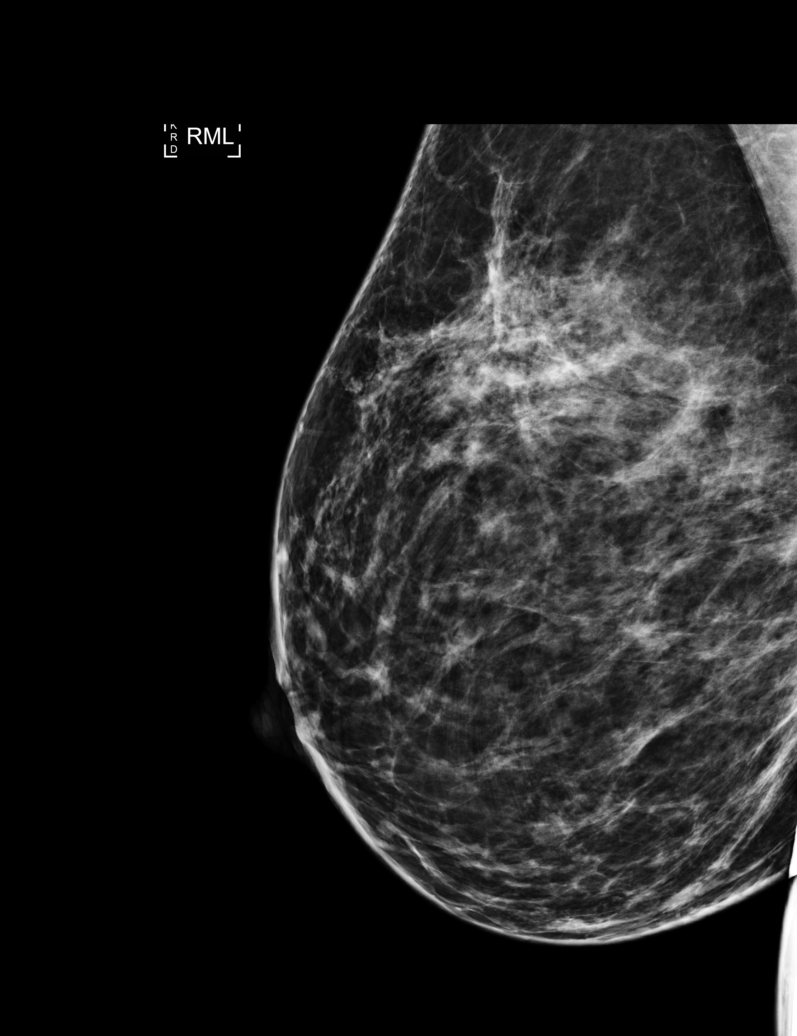

[R CC (2 of 4)]
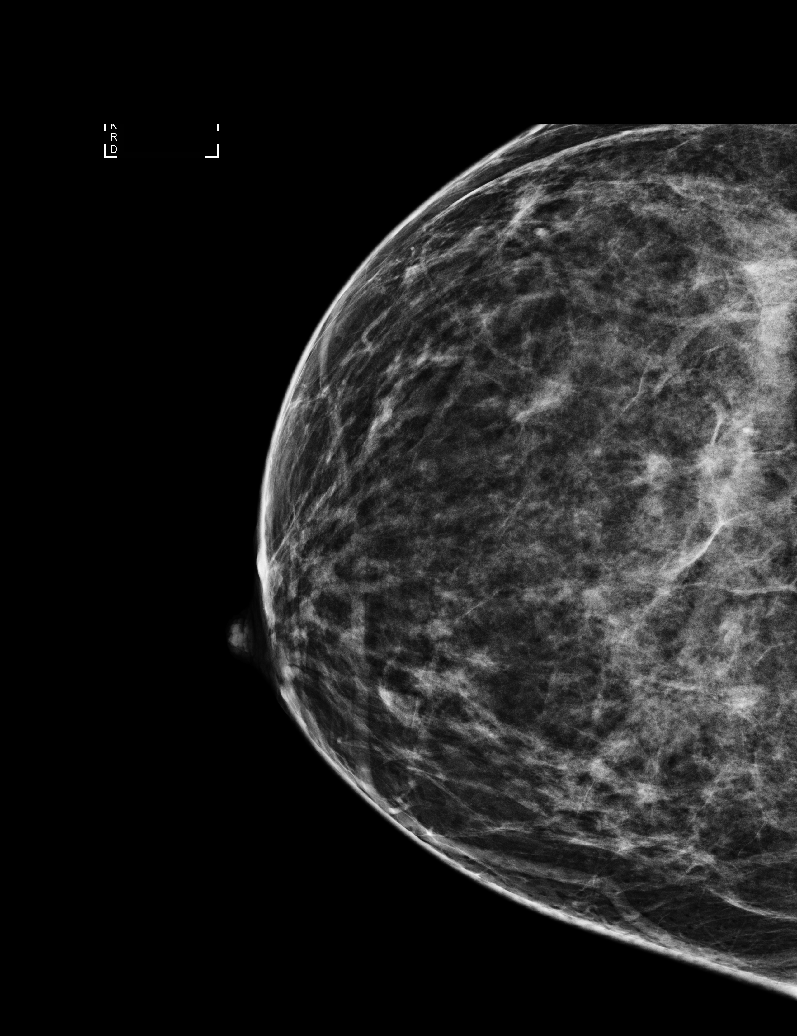

[R CC (3 of 4)]
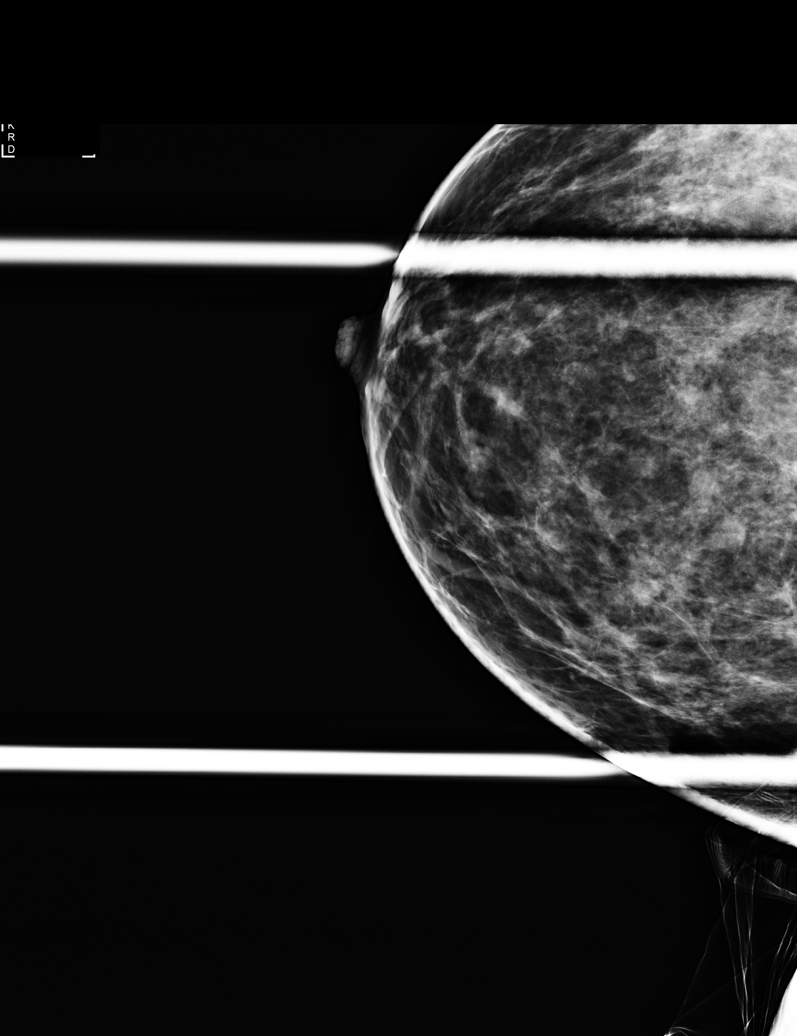

[R CC (4 of 4)]
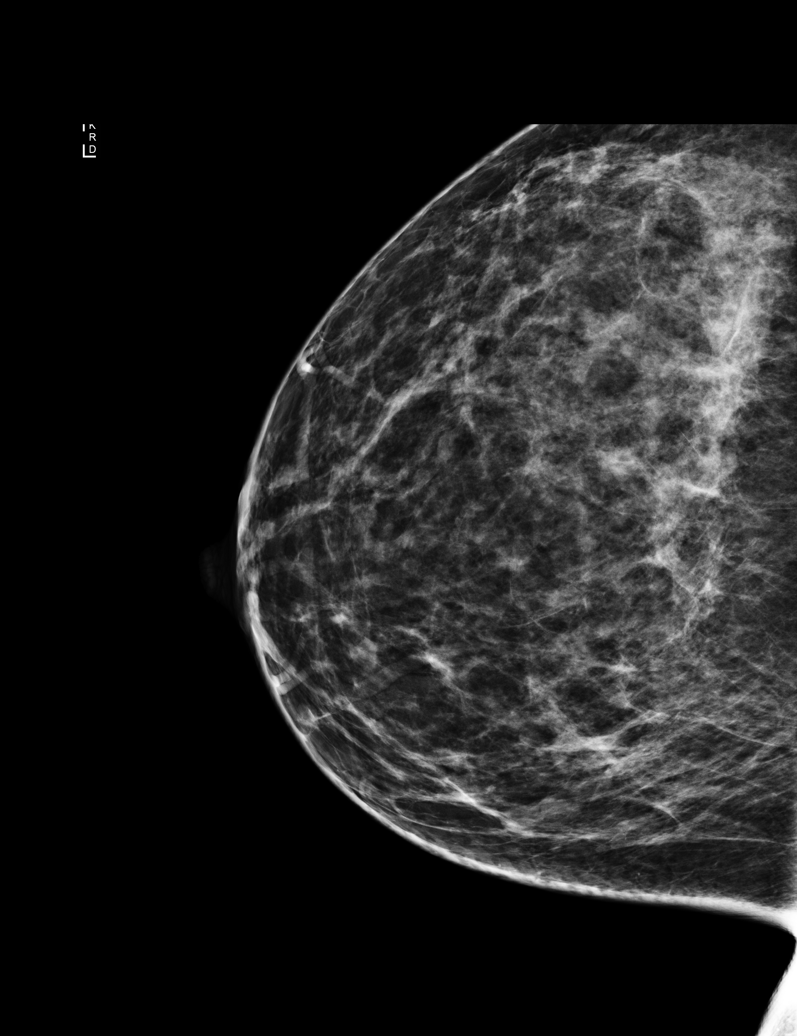

[5 of 5 positions shown; findings below may reference images not displayed]

ACR Breast Density Category c: The breast tissue is heterogeneously
dense, which may obscure small masses.
FINDINGS: On additional views, the possible asymmetry seen on screening
mammogram does not persist.

Mammographic images were processed with CAD.

On targeted physical exam of the medial right breast, no discrete
mass is identified.

Targeted ultrasound of the medial right breast demonstrates no
suspicious cystic or solid sonographic finding. Scattered small
cysts are incidentally noted.
IMPRESSION: No evidence of malignancy.

RECOMMENDATION:
Screening mammogram at age 40 unless there are persistent or
intervening clinical concerns. (Code:W7-X-6P2)

I have discussed the findings and recommendations with the patient.
Results were also provided in writing at the conclusion of the
visit. If applicable, a reminder letter will be sent to the patient
regarding the next appointment.

BI-RADS CATEGORY  1: Negative.

## 2015-07-23 ENCOUNTER — Ambulatory Visit: Payer: BC Managed Care – PPO | Admitting: Family Medicine

## 2015-09-22 ENCOUNTER — Ambulatory Visit: Payer: BC Managed Care – PPO | Admitting: Family Medicine

## 2015-12-31 ENCOUNTER — Telehealth: Payer: Self-pay | Admitting: Family Medicine

## 2016-01-02 ENCOUNTER — Other Ambulatory Visit: Payer: Self-pay | Admitting: Family Medicine

## 2016-01-02 DIAGNOSIS — Z862 Personal history of diseases of the blood and blood-forming organs and certain disorders involving the immune mechanism: Secondary | ICD-10-CM

## 2016-01-02 DIAGNOSIS — Z01419 Encounter for gynecological examination (general) (routine) without abnormal findings: Secondary | ICD-10-CM

## 2016-01-02 DIAGNOSIS — E559 Vitamin D deficiency, unspecified: Secondary | ICD-10-CM

## 2016-01-02 DIAGNOSIS — E8881 Metabolic syndrome: Secondary | ICD-10-CM

## 2016-01-02 DIAGNOSIS — E785 Hyperlipidemia, unspecified: Secondary | ICD-10-CM

## 2016-01-02 NOTE — Telephone Encounter (Signed)
Done. Ordered for [employer]

## 2016-01-03 NOTE — Telephone Encounter (Signed)
Patient notified

## 2016-01-04 LAB — CBC WITH DIFFERENTIAL/PLATELET
Basophils Absolute: 81 cells/uL (ref 0–200)
Basophils Relative: 1 %
Eosinophils Absolute: 243 cells/uL (ref 15–500)
Eosinophils Relative: 3 %
HCT: 43.4 % (ref 35.0–45.0)
Hemoglobin: 14.1 g/dL (ref 11.7–15.5)
Lymphocytes Relative: 33 %
Lymphs Abs: 2673 cells/uL (ref 850–3900)
MCH: 23.7 pg — ABNORMAL LOW (ref 27.0–33.0)
MCHC: 32.5 g/dL (ref 32.0–36.0)
MCV: 72.8 fL — ABNORMAL LOW (ref 80.0–100.0)
Monocytes Absolute: 648 cells/uL (ref 200–950)
Monocytes Relative: 8 %
Neutro Abs: 4455 cells/uL (ref 1500–7800)
Neutrophils Relative %: 55 %
Platelets: 243 10*3/uL (ref 140–400)
RBC: 5.96 MIL/uL — ABNORMAL HIGH (ref 3.80–5.10)
RDW: 15.3 % — ABNORMAL HIGH (ref 11.0–15.0)
WBC: 8.1 10*3/uL (ref 3.8–10.8)

## 2016-01-04 LAB — COMPLETE METABOLIC PANEL WITH GFR
ALBUMIN: 4.4 g/dL (ref 3.6–5.1)
ALK PHOS: 46 U/L (ref 33–115)
ALT: 16 U/L (ref 6–29)
AST: 17 U/L (ref 10–35)
BILIRUBIN TOTAL: 0.6 mg/dL (ref 0.2–1.2)
BUN: 12 mg/dL (ref 7–25)
CALCIUM: 8.5 mg/dL — AB (ref 8.6–10.2)
CO2: 22 mmol/L (ref 20–31)
CREATININE: 0.83 mg/dL (ref 0.50–1.10)
Chloride: 101 mmol/L (ref 98–110)
GFR, Est African American: >89 mL/min (ref >=60)
GFR, Est Non African American: 85 mL/min (ref >=60)
GLUCOSE: 108 mg/dL — AB (ref 65–99)
Potassium: 4.2 mmol/L (ref 3.5–5.3)
SODIUM: 140 mmol/L (ref 135–146)
TOTAL PROTEIN: 7.2 g/dL (ref 6.1–8.1)

## 2016-01-04 LAB — LIPID PANEL
CHOL/HDL RATIO: 4.4 ratio (ref <=5.0)
CHOLESTEROL: 177 mg/dL (ref 125–200)
HDL: 40 mg/dL — ABNORMAL LOW (ref >=46)
LDL Cholesterol: 110 mg/dL (ref <130)
Triglycerides: 136 mg/dL (ref <150)
VLDL: 27 mg/dL (ref <30)

## 2016-01-04 LAB — TSH: TSH: 1.22 mIU/L

## 2016-01-04 LAB — VITAMIN B12: Vitamin B-12: 650 pg/mL (ref 200–1100)

## 2016-01-05 LAB — HEMOGLOBIN A1C

## 2016-01-05 LAB — VITAMIN D 25 HYDROXY (VIT D DEFICIENCY, FRACTURES): Vit D, 25-Hydroxy: 32 ng/mL (ref 30–100)

## 2016-01-13 ENCOUNTER — Encounter: Payer: BC Managed Care – PPO | Admitting: Family Medicine

## 2016-01-26 ENCOUNTER — Ambulatory Visit (INDEPENDENT_AMBULATORY_CARE_PROVIDER_SITE_OTHER): Payer: BC Managed Care – PPO | Admitting: Family Medicine

## 2016-01-26 ENCOUNTER — Encounter: Payer: Self-pay | Admitting: Family Medicine

## 2016-01-26 VITALS — BP 126/68 | HR 78 | Temp 98.3°F | Resp 16 | Ht 64.0 in | Wt 179.5 lb

## 2016-01-26 DIAGNOSIS — Z0000 Encounter for general adult medical examination without abnormal findings: Secondary | ICD-10-CM | POA: Diagnosis not present

## 2016-01-26 DIAGNOSIS — Z1239 Encounter for other screening for malignant neoplasm of breast: Secondary | ICD-10-CM | POA: Diagnosis not present

## 2016-01-26 DIAGNOSIS — N63 Unspecified lump in breast: Secondary | ICD-10-CM

## 2016-01-26 DIAGNOSIS — L659 Nonscarring hair loss, unspecified: Secondary | ICD-10-CM | POA: Diagnosis not present

## 2016-01-26 DIAGNOSIS — E8881 Metabolic syndrome: Secondary | ICD-10-CM

## 2016-01-26 DIAGNOSIS — Z01419 Encounter for gynecological examination (general) (routine) without abnormal findings: Secondary | ICD-10-CM

## 2016-01-26 DIAGNOSIS — L219 Seborrheic dermatitis, unspecified: Secondary | ICD-10-CM | POA: Diagnosis not present

## 2016-01-26 DIAGNOSIS — F32A Depression, unspecified: Secondary | ICD-10-CM | POA: Insufficient documentation

## 2016-01-26 DIAGNOSIS — F329 Major depressive disorder, single episode, unspecified: Secondary | ICD-10-CM

## 2016-01-26 DIAGNOSIS — N6320 Unspecified lump in the left breast, unspecified quadrant: Secondary | ICD-10-CM

## 2016-01-26 DIAGNOSIS — E785 Hyperlipidemia, unspecified: Secondary | ICD-10-CM | POA: Diagnosis not present

## 2016-01-26 LAB — POCT GLYCOSYLATED HEMOGLOBIN (HGB A1C): HEMOGLOBIN A1C: 6

## 2016-01-26 MED ORDER — SELENIUM SULFIDE 2.25 % EX SHAM: 1.0000 | MEDICATED_SHAMPOO | CUTANEOUS | 0 refills | Status: DC

## 2016-01-26 NOTE — Progress Notes (Signed)
Name: Brittney Higgins   MRN: [id number]    DOB: [birth year]   Date:01/26/2016       Progress Note  Subjective  Chief Complaint  Chief Complaint  Patient presents with  . Annual Exam    HPI  Well Woman : patient has been feeling well, no cycles since January 2015 because of endometrial ablation for DUB.  She denies hot flashes and night sweats. She has noticed a flare of seborrheic dermatitis of scalp and would like a refill of her medication/shampoo. She has been sexually active, no pain during intercourse. Married  Depression: she states she has been feeling sad and teary, no change in appetite, work is less stressful now. She denies mood swings. She denies anhedonia. She has talked to her mother and also husband, she does not want to take medications, discussed counseling.   Metabolic Syndrome: she lost 4 lbs, but did not change diet but she has been more physically active. She denies polyphagia, polydipsia or polyuria  Dyslipidemia: reviewed labs,to improve HDL patient  needs to eat tree nuts ( pecans/pistachios/almonds ) four times weekly, eat fish two times weekly  and exercise  at least 150 minutes per week  Hair loss: she has seen Dermatologist about 7 years ago, but she continues to lose hair, it makes her very upset , does not like how it looks.   Patient Active Problem List   Diagnosis Date Noted  . Seborrheic dermatitis of scalp 01/08/2015  . Metabolic syndrome 01/08/2015  . Lipoma of arm 01/08/2015  . History of anemia 01/01/2015  . Dyslipidemia 01/01/2015  . Irregular bleeding 01/01/2015  . Obesity (BMI 30.0-34.9) 01/01/2015  . Vitamin D deficiency 01/01/2015  . Chronic constipation 01/01/2015  . Fibroid 01/01/2015  . Bunion 01/01/2015  . Abnormal CBC 01/01/2015    Past Surgical History:  Procedure Laterality Date  . DILITATION & CURRETTAGE/HYSTROSCOPY WITH NOVASURE ABLATION N/A 06/04/2013   Procedure: DILATATION & CURETTAGE/HYSTEROSCOPY WITH NOVASURE ABLATION WITH  ATTEMPTED VERSAPOINT;  Surgeon: Lenoard Aden, MD;  Location: [facility];  Service: Gynecology;  Laterality: N/A;  . NO PAST SURGERIES      Family History  Problem Relation Age of Onset  . Hypertension Mother   . COPD Mother   . Colon polyps Father   . Heart disease Father   . CAD Father   . AAA (abdominal aortic aneurysm) Father   . Diabetes Father   . Hypertension Sister   . Diabetes Sister   . Sleep apnea Sister   . Asthma Son     Social History   Social History  . Marital status: Married    Spouse name: N/A  . Number of children: N/A  . Years of education: N/A   Occupational History  . Not on file.   Social History Main Topics  . Smoking status: Never Smoker  . Smokeless tobacco: Never Used  . Alcohol use No  . Drug use: No  . Sexual activity: Yes    Partners: Male    Birth control/ protection: None   Other Topics Concern  . Not on file   Social History Narrative  . No narrative on file     Current Outpatient Prescriptions:  .  [START ON 01/27/2016] Selenium Sulfide 2.25 % SHAM, Apply 1 Dose topically 2 (two) times a week., Disp: 180 mL, Rfl: 0 .  Vitamin D, Ergocalciferol, (DRISDOL) 50000 UNITS CAPS capsule, TAKE 1 CAPSULE EVERY 14 DAYS, Disp: 12 capsule, Rfl: 1  No Known Allergies  ROS  Constitutional: Negative for fever , positive for weight change.  Respiratory: Negative for cough and shortness of breath.   Cardiovascular: Negative for chest pain or palpitations.  Gastrointestinal: Negative for abdominal pain, no bowel changes.  Musculoskeletal: Negative for gait problem or joint swelling.  Skin: Negative for rash.  Neurological: Negative for dizziness or headache.  No other specific complaints in a complete review of systems (except as listed in HPI above).  Objective  Vitals:   01/26/16 0931  BP: 126/68  Pulse: 78  Resp: 16  Temp: 98.3 F (36.8 C)  TempSrc: Oral  SpO2: 99%  Weight: 179 lb 8 oz (81.4 kg)  Height: 5\' 4"  (1.626 m)     Body mass index is 30.81 kg/m.  Physical Exam  Constitutional: Patient appears well-developed and obese. No distress.  HENT: Head: Normocephalic and atraumatic. Ears: B TMs ok, no erythema or effusion; Nose: Nose normal. Mouth/Throat: Oropharynx is clear and moist. No oropharyngeal exudate.  Eyes: Conjunctivae and EOM are normal. Pupils are equal, round, and reactive to light. No scleral icterus.  Neck: Normal range of motion. Neck supple. No JVD present. No thyromegaly present.  Cardiovascular: Normal rate, regular rhythm and normal heart sounds.  No murmur heard. No BLE edema. Pulmonary/Chest: Effort normal and breath sounds normal. No respiratory distress. Abdominal: Soft. Bowel sounds are normal, no distension. There is no tenderness. no masses Breast: no lumps or masses, no nipple discharge or rashes FEMALE GENITALIA:  External genitalia normal External urethra normal Vaginal vault normal without discharge or lesions Not done RECTAL: not done Musculoskeletal: Normal range of motion, no joint effusions. No gross deformities Neurological: he is alert and oriented to person, place, and time. No cranial nerve deficit. Coordination, balance, strength, speech and gait are normal.  Skin: Skin is warm and dry. No rash noted. No erythema. Wearing a wig ( pony tail ) Psychiatric: Patient has a normal mood and affect. behavior is normal. Judgment and thought content normal.  Recent Results (from the past 2160 hour(s))  Lipid panel     Status: Abnormal   Collection Time: 01/03/16  8:09 AM  Result Value Ref Range   Cholesterol 177 125 - 200 mg/dL   Triglycerides 696 <295 mg/dL   HDL 40 (L) >=28 mg/dL   Total CHOL/HDL Ratio 4.4 <=5.0 Ratio   VLDL 27 <30 mg/dL   LDL Cholesterol 413 <244 mg/dL    Comment:   Total Cholesterol/HDL Ratio:CHD Risk                        Coronary Heart Disease Risk Table                                        Men       Women          1/2 Average Risk               3.4        3.3              Average Risk              5.0        4.4           2X Average Risk              9.6  7.1           3X Average Risk             23.4       11.0 Use the calculated Patient Ratio above and the CHD Risk table  to determine the patient's CHD Risk.   COMPLETE METABOLIC PANEL WITH GFR     Status: Abnormal   Collection Time: 01/03/16  8:09 AM  Result Value Ref Range   Sodium 140 135 - 146 mmol/L   Potassium 4.2 3.5 - 5.3 mmol/L   Chloride 101 98 - 110 mmol/L   CO2 22 20 - 31 mmol/L   Glucose, Bld 108 (H) 65 - 99 mg/dL   BUN 12 7 - 25 mg/dL   Creat 7.82 9.56 - 2.13 mg/dL   Total Bilirubin 0.6 0.2 - 1.2 mg/dL   Alkaline Phosphatase 46 33 - 115 U/L   AST 17 10 - 35 U/L   ALT 16 6 - 29 U/L   Total Protein 7.2 6.1 - 8.1 g/dL   Albumin 4.4 3.6 - 5.1 g/dL   Calcium 8.5 (L) 8.6 - 10.2 mg/dL   GFR, Est African American >89 >=60 mL/min   GFR, Est Non African American 85 >=60 mL/min  CBC with Differential/Platelet     Status: Abnormal   Collection Time: 01/03/16  8:09 AM  Result Value Ref Range   WBC 8.1 3.8 - 10.8 K/uL   RBC 5.96 (H) 3.80 - 5.10 MIL/uL   Hemoglobin 14.1 11.7 - 15.5 g/dL   HCT 08.6 57.8 - 46.9 %   MCV 72.8 (L) 80.0 - 100.0 fL   MCH 23.7 (L) 27.0 - 33.0 pg   MCHC 32.5 32.0 - 36.0 g/dL   RDW 62.9 (H) 52.8 - 41.3 %   Platelets 243 140 - 400 K/uL   MPV Not Performed 7.5 - 12.5 fL   Neutro Abs 4,455 1,500 - 7,800 cells/uL   Lymphs Abs 2,673 850 - 3,900 cells/uL   Monocytes Absolute 648 200 - 950 cells/uL   Eosinophils Absolute 243 15 - 500 cells/uL   Basophils Absolute 81 0 - 200 cells/uL   Neutrophils Relative % 55 %   Lymphocytes Relative 33 %   Monocytes Relative 8 %   Eosinophils Relative 3 %   Basophils Relative 1 %   Smear Review SEE NOTE     Comment:   RBC, Platelet and WBC Morphology unremarkable ** Please note change in unit of measure and reference range(s). **   Hemoglobin A1c     Status: None   Collection Time:  01/03/16  8:09 AM  Result Value Ref Range   Hgb A1c MFr Bld CANCELED <5.7 %    Comment: Quantity not sufficient, unable to perform test(s).  Result canceled by the ancillary    Mean Plasma Glucose CANCELED mg/dL    Comment: Result canceled by the ancillary  VITAMIN D 25 Hydroxy (Vit-D Deficiency, Fractures)     Status: None   Collection Time: 01/03/16  8:09 AM  Result Value Ref Range   Vit D, 25-Hydroxy 32 30 - 100 ng/mL    Comment: Vitamin D Status           25-OH Vitamin D        Deficiency                <20 ng/mL        Insufficiency         20 - 29 ng/mL  Optimal             > or = 30 ng/mL   For 25-OH Vitamin D testing on patients on D2-supplementation and patients for whom quantitation of D2 and D3 fractions is required, the QuestAssureD 25-OH VIT D, (D2,D3), LC/MS/MS is recommended: order code 16109 (patients > 2 yrs).   TSH     Status: None   Collection Time: 01/03/16  8:09 AM  Result Value Ref Range   TSH 1.22 mIU/L    Comment:   Reference Range   > or = 20 Years  0.40-4.50   Pregnancy Range First trimester  0.26-2.66 Second trimester 0.55-2.73 Third trimester  0.43-2.91     Vitamin B12     Status: None   Collection Time: 01/03/16  8:09 AM  Result Value Ref Range   Vitamin B-12 650 200 - 1,100 pg/mL     PHQ2/9: Depression screen [facility] 2/9 01/26/2016 01/08/2015  Decreased Interest 0 0  Down, Depressed, Hopeless 0 0  PHQ - 2 Score 0 0    Fall Risk: Fall Risk  01/26/2016 01/08/2015  Falls in the past year? No No     Functional Status Survey: Is the patient deaf or have difficulty hearing?: No Does the patient have difficulty seeing, even when wearing glasses/contacts?: No Does the patient have difficulty concentrating, remembering, or making decisions?: No Does the patient have difficulty walking or climbing stairs?: No Does the patient have difficulty dressing or bathing?: No Does the patient have difficulty doing errands alone such as visiting  a doctor's office or shopping?: No    Assessment & Plan  1. Well woman exam  Discussed importance of 150 minutes of physical activity weekly, eat two servings of fish weekly, eat one serving of tree nuts ( cashews, pistachios, pecans, almonds.Marland Kitchen) every other day, eat 6 servings of fruit/vegetables daily and drink plenty of water and avoid sweet beverages.   2. Breast cancer screening  - MM Digital Screening; Future  3. Metabolic syndrome  - POCT HgB U0A  4. Seborrheic dermatitis  - Selenium Sulfide 2.25 % SHAM; Apply 1 Dose topically 2 (two) times a week.  Dispense: 180 mL; Refill: 0  5. Dyslipidemia  Lipid panel shows low HDL : to improve HDL patient  needs to eat tree nuts ( pecans/pistachios/almonds ) four times weekly, eat fish two times weekly  and exercise  at least 150 minutes per week  6. Depression  Discussed starting medication or therapy, she will think about it and return for follow up if needed.   7. Hair loss  - Ambulatory referral to Dermatology

## 2016-02-02 ENCOUNTER — Ambulatory Visit: Payer: BC Managed Care – PPO

## 2016-02-16 ENCOUNTER — Other Ambulatory Visit: Payer: Self-pay | Admitting: Family Medicine

## 2016-02-16 ENCOUNTER — Ambulatory Visit
Admission: RE | Admit: 2016-02-16 | Discharge: 2016-02-16 | Disposition: A | Discharge status: Home / Self Care | Payer: BC Managed Care – PPO | Source: Ambulatory Visit | Attending: Family Medicine | Admitting: Family Medicine

## 2016-02-16 DIAGNOSIS — Z1231 Encounter for screening mammogram for malignant neoplasm of breast: Secondary | ICD-10-CM | POA: Insufficient documentation

## 2016-02-16 DIAGNOSIS — Z1239 Encounter for other screening for malignant neoplasm of breast: Secondary | ICD-10-CM

## 2017-01-26 ENCOUNTER — Ambulatory Visit (INDEPENDENT_AMBULATORY_CARE_PROVIDER_SITE_OTHER): Payer: BC Managed Care – PPO | Admitting: Family Medicine

## 2017-01-26 ENCOUNTER — Encounter: Payer: Self-pay | Admitting: Family Medicine

## 2017-01-26 VITALS — BP 118/72 | HR 82 | Temp 98.1°F | Resp 16 | Ht 64.0 in | Wt 180.1 lb

## 2017-01-26 DIAGNOSIS — Z6830 Body mass index (BMI) 30.0-30.9, adult: Secondary | ICD-10-CM | POA: Diagnosis not present

## 2017-01-26 DIAGNOSIS — E785 Hyperlipidemia, unspecified: Secondary | ICD-10-CM | POA: Diagnosis not present

## 2017-01-26 DIAGNOSIS — E8881 Metabolic syndrome: Secondary | ICD-10-CM | POA: Diagnosis not present

## 2017-01-26 DIAGNOSIS — L219 Seborrheic dermatitis, unspecified: Secondary | ICD-10-CM | POA: Diagnosis not present

## 2017-01-26 DIAGNOSIS — E559 Vitamin D deficiency, unspecified: Secondary | ICD-10-CM

## 2017-01-26 DIAGNOSIS — Z0000 Encounter for general adult medical examination without abnormal findings: Secondary | ICD-10-CM

## 2017-01-26 DIAGNOSIS — E669 Obesity, unspecified: Secondary | ICD-10-CM | POA: Diagnosis not present

## 2017-01-26 DIAGNOSIS — L659 Nonscarring hair loss, unspecified: Secondary | ICD-10-CM

## 2017-01-26 DIAGNOSIS — Z23 Encounter for immunization: Secondary | ICD-10-CM

## 2017-01-26 DIAGNOSIS — Z1239 Encounter for other screening for malignant neoplasm of breast: Secondary | ICD-10-CM

## 2017-01-26 DIAGNOSIS — L689 Hypertrichosis, unspecified: Secondary | ICD-10-CM

## 2017-01-26 DIAGNOSIS — Z01419 Encounter for gynecological examination (general) (routine) without abnormal findings: Secondary | ICD-10-CM

## 2017-01-26 LAB — CBC WITH DIFFERENTIAL/PLATELET
BASOS ABS: 42 {cells}/uL (ref 0–200)
Basophils Relative: 1 %
EOS ABS: 168 {cells}/uL (ref 15–500)
Eosinophils Relative: 4 %
HCT: 41.2 % (ref 35.0–45.0)
Hemoglobin: 13.1 g/dL (ref 11.7–15.5)
LYMPHS PCT: 51 %
Lymphs Abs: 2142 cells/uL (ref 850–3900)
MCH: 22.5 pg — AB (ref 27.0–33.0)
MCHC: 31.8 g/dL — AB (ref 32.0–36.0)
MCV: 70.8 fL — AB (ref 80.0–100.0)
MPV: 10.1 fL (ref 7.5–12.5)
Monocytes Absolute: 336 cells/uL (ref 200–950)
Monocytes Relative: 8 %
Neutro Abs: 1512 cells/uL (ref 1500–7800)
Neutrophils Relative %: 36 %
PLATELETS: 251 10*3/uL (ref 140–400)
RBC: 5.82 MIL/uL — ABNORMAL HIGH (ref 3.80–5.10)
RDW: 15 % (ref 11.0–15.0)
WBC: 4.2 10*3/uL (ref 3.8–10.8)

## 2017-01-26 MED ORDER — VITAMIN D (ERGOCALCIFEROL) 1.25 MG (50000 UNIT) PO CAPS: ORAL_CAPSULE | ORAL | 3 refills | Status: DC

## 2017-01-26 NOTE — Patient Instructions (Signed)
I will survive 5 k - with training.   Preventive Care 40-64 Years, Female Preventive care refers to lifestyle choices and visits with your health care provider that can promote health and wellness. What does preventive care include?  A yearly physical exam. This is also called an annual well check.  Dental exams once or twice a year.  Routine eye exams. Ask your health care provider how often you should have your eyes checked.  Personal lifestyle choices, including: ? Daily care of your teeth and gums. ? Regular physical activity. ? Eating a healthy diet. ? Avoiding tobacco and drug use. ? Limiting alcohol use. ? Practicing safe sex. ? Taking low-dose aspirin daily starting at age 26. ? Taking vitamin and mineral supplements as recommended by your health care provider. What happens during an annual well check? The services and screenings done by your health care provider during your annual well check will depend on your age, overall health, lifestyle risk factors, and family history of disease. Counseling Your health care provider may ask you questions about your:  Alcohol use.  Tobacco use.  Drug use.  Emotional well-being.  Home and relationship well-being.  Sexual activity.  Eating habits.  Work and work Statistician.  Method of birth control.  Menstrual cycle.  Pregnancy history.  Screening You may have the following tests or measurements:  Height, weight, and BMI.  Blood pressure.  Lipid and cholesterol levels. These may be checked every 5 years, or more frequently if you are over 72 years old.  Skin check.  Lung cancer screening. You may have this screening every year starting at age 47 if you have a 30-pack-year history of smoking and currently smoke or have quit within the past 15 years.  Fecal occult blood test (FOBT) of the stool. You may have this test every year starting at age 2.  Flexible sigmoidoscopy or colonoscopy. You may have a  sigmoidoscopy every 5 years or a colonoscopy every 10 years starting at age 83.  Hepatitis C blood test.  Hepatitis B blood test.  Sexually transmitted disease (STD) testing.  Diabetes screening. This is done by checking your blood sugar (glucose) after you have not eaten for a while (fasting). You may have this done every 1-3 years.  Mammogram. This may be done every 1-2 years. Talk to your health care provider about when you should start having regular mammograms. This may depend on whether you have a family history of breast cancer.  BRCA-related cancer screening. This may be done if you have a family history of breast, ovarian, tubal, or peritoneal cancers.  Pelvic exam and Pap test. This may be done every 3 years starting at age 3. Starting at age 93, this may be done every 5 years if you have a Pap test in combination with an HPV test.  Bone density scan. This is done to screen for osteoporosis. You may have this scan if you are at high risk for osteoporosis.  Discuss your test results, treatment options, and if necessary, the need for more tests with your health care provider. Vaccines Your health care provider may recommend certain vaccines, such as:  Influenza vaccine. This is recommended every year.  Tetanus, diphtheria, and acellular pertussis (Tdap, Td) vaccine. You may need a Td booster every 10 years.  Varicella vaccine. You may need this if you have not been vaccinated.  Zoster vaccine. You may need this after age 40.  Measles, mumps, and rubella (MMR) vaccine. You may need at  least one dose of MMR if you were born in 1957 or later. You may also need a second dose.  Pneumococcal 13-valent conjugate (PCV13) vaccine. You may need this if you have certain conditions and were not previously vaccinated.  Pneumococcal polysaccharide (PPSV23) vaccine. You may need one or two doses if you smoke cigarettes or if you have certain conditions.  Meningococcal vaccine. You may  need this if you have certain conditions.  Hepatitis A vaccine. You may need this if you have certain conditions or if you travel or work in places where you may be exposed to hepatitis A.  Hepatitis B vaccine. You may need this if you have certain conditions or if you travel or work in places where you may be exposed to hepatitis B.  Haemophilus influenzae type b (Hib) vaccine. You may need this if you have certain conditions.  Talk to your health care provider about which screenings and vaccines you need and how often you need them. This information is not intended to replace advice given to you by your health care provider. Make sure you discuss any questions you have with your health care provider. Document Released: 06/11/2015 Document Revised: 02/02/2016 Document Reviewed: 03/16/2015 Elsevier Interactive Patient Education  2017 [employer].

## 2017-01-26 NOTE — Progress Notes (Signed)
Name: Brittney Higgins   MRN: [id number]    DOB: [birth year]   Date:01/26/2017       Progress Note  Subjective  Chief Complaint  Chief Complaint  Patient presents with  . Annual Exam    HPI  Well Woman : patient has been feeling well, no cycles since January 2015 because of endometrial ablation for DUB. She denies hot flashes and night sweats. She has been sexually active, denies vaginal discharge pain during intercourse. Married, has one son at home.   Dysthymia: doing better, but still has some sad spells, that lasts a few days, no crying spells at this time. She feels better when husband and son are around.   Metabolic Syndrome: weight is stable, not as active lately.  She denies polyphagia, polydipsia or polyuria  Dyslipidemia: reviewed labs,to improve HDL patient  needs to eat tree nuts ( pecans/pistachios/almonds ) four times weekly, eat fish two times weekly  and exercise  at least 150 minutes per week  Hair loss: she has seen Dermatologist about 8 years ago, but she continues to lose hair, it makes her very upset , does not like how it looks.  We will refer her to another provider.    Patient Active Problem List   Diagnosis Date Noted  . Depression 01/26/2016  . Seborrheic dermatitis of scalp 01/08/2015  . Metabolic syndrome 01/08/2015  . Lipoma of arm 01/08/2015  . History of anemia 01/01/2015  . Dyslipidemia 01/01/2015  . Irregular bleeding 01/01/2015  . Obesity (BMI 30.0-34.9) 01/01/2015  . Vitamin D deficiency 01/01/2015  . Chronic constipation 01/01/2015  . Fibroid 01/01/2015  . Bunion 01/01/2015  . Abnormal CBC 01/01/2015    Past Surgical History:  Procedure Laterality Date  . BREAST CYST ASPIRATION  age 38  . DILITATION & CURRETTAGE/HYSTROSCOPY WITH NOVASURE ABLATION N/A 06/04/2013   Procedure: DILATATION & CURETTAGE/HYSTEROSCOPY WITH NOVASURE ABLATION WITH ATTEMPTED VERSAPOINT;  Surgeon: Lenoard Aden, MD;  Location: [facility];  Service: Gynecology;   Laterality: N/A;    Family History  Problem Relation Age of Onset  . Hypertension Mother   . COPD Mother   . Colon polyps Father   . Heart disease Father   . CAD Father   . AAA (abdominal aortic aneurysm) Father   . Diabetes Father   . Hypertension Sister   . Diabetes Sister   . Sleep apnea Sister   . Asthma Son   . Diabetes Sister   . Diabetes Sister   . Breast cancer Neg Hx     Social History   Social History  . Marital status: Married    Spouse name: N/A  . Number of children: 1  . Years of education: N/A   Occupational History  . assistance principal  [employer]    [employer]   Social History Main Topics  . Smoking status: Never Smoker  . Smokeless tobacco: Never Used  . Alcohol use No  . Drug use: No  . Sexual activity: Yes    Partners: Male    Birth control/ protection: None   Other Topics Concern  . 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  Respiratory: Negative for cough  and shortness of breath.   Cardiovascular: Negative for chest pain or palpitations.  Gastrointestinal: Negative for abdominal pain, no bowel changes.  Musculoskeletal: Negative for gait problem or joint swelling.  Skin: Negative for rash.  Neurological: Negative for dizziness or headache.  No other specific complaints in a complete review of systems (except as listed in HPI above).  Objective  Vitals:   01/26/17 0830  BP: 118/72  Pulse: 82  Resp: 16  Temp: 98.1 F (36.7 C)  SpO2: 96%  Weight: 180 lb 2 oz (81.7 kg)  Height: 5\' 4"  (1.626 m)    Body mass index is 30.92 kg/m.  Physical Exam  Constitutional: Patient appears well-developed and obese No distress.  HENT: Head:  Normocephalic and atraumatic. Ears: B TMs ok, no erythema or effusion; Nose: Nose normal. Mouth/Throat: Oropharynx is clear and moist. No oropharyngeal exudate.  Eyes: Conjunctivae and EOM are normal. Pupils are equal, round, and reactive to light. No scleral icterus.  Neck: Normal range of motion. Neck supple. No JVD present. No thyromegaly present.  Cardiovascular: Normal rate, regular rhythm and normal heart sounds.  No murmur heard. No BLE edema. Pulmonary/Chest: Effort normal and breath sounds normal. No respiratory distress. Abdominal: Soft. Bowel sounds are normal, no distension. There is no tenderness. no masses Breast: no lumps or masses, no nipple discharge or rashes FEMALE GENITALIA:  External genitalia normal External urethra normal Not done RECTAL: not done Musculoskeletal: Normal range of motion, no joint effusions. No gross deformities Neurological: he is alert and oriented to person, place, and time. No cranial nerve deficit. Coordination, balance, strength, speech and gait are normal.  Skin: Skin is warm and dry. No rash noted. No erythema.  Psychiatric: Patient has a normal mood and affect. behavior is normal. Judgment and thought content normal.  PHQ2/9: Depression screen [facility] 2/9 01/26/2017 01/26/2016 01/08/2015  Decreased Interest 0 0 0  Down, Depressed, Hopeless 0 0 0  PHQ - 2 Score 0 0 0     Fall Risk: Fall Risk  01/26/2017 01/26/2016 01/08/2015  Falls in the past year? Yes No No  Number falls in past yr: 1 - -  Injury with Fall? No - -    Functional Status Survey: Is the patient deaf or have difficulty hearing?: No Does the patient have difficulty seeing, even when wearing glasses/contacts?: No Does the patient have difficulty concentrating, remembering, or making decisions?: No Does the patient have difficulty walking or climbing stairs?: No Does the patient have difficulty dressing or bathing?: No Does the patient have difficulty doing errands alone such as  visiting a doctor's office or shopping?: No  Current Exercise Habits: The patient does not participate in regular exercise at present    Assessment & Plan  1. Well woman exam  Discussed importance of 150 minutes of physical activity weekly, eat two servings of fish weekly, eat one serving of tree nuts ( cashews, pistachios, pecans, almonds.Marland Kitchen) every other day, eat 6 servings of fruit/vegetables daily and drink plenty of water and avoid sweet beverages.  - Vitamin D, Ergocalciferol, (DRISDOL) 50000 units CAPS capsule; TAKE 1 CAPSULE EVERY 14 DAYS  Dispense: 12 capsule; Refill: 3 - Hemoglobin A1c - CBC with Differential/Platelet - Lipid panel - Insulin, fasting - COMPLETE METABOLIC PANEL WITH GFR - TSH - Vitamin B12 - VITAMIN D 25 Hydroxy (Vit-D Deficiency, Fractures) - MM Digital Screening; Future  2. Metabolic syndrome  - Hemoglobin A1c - Insulin, fasting  3. Breast cancer screening  - MM Digital Screening; Future  4. Dyslipidemia  - Lipid panel  5. Hair loss  - Ambulatory referral to Dermatology - TSH  6. Seborrheic dermatitis of scalp  - Ambulatory referral to Dermatology  7. Vitamin D deficiency  - Vitamin D, Ergocalciferol, (DRISDOL) 50000 units CAPS capsule; TAKE 1 CAPSULE EVERY 14 DAYS  Dispense: 12 capsule; Refill: 3 - VITAMIN D 25 Hydroxy (Vit-D Deficiency, Fractures)  8. Obesity (BMI 30.0-34.9)  Discussed with the patient the risk posed by an increased BMI. Discussed importance of portion control,  . calorie counting and at least 150 minutes of physical activity weekly. Avoid sweet beverages and drink more water. Eat at least 6 servings of fruit and vegetables daily   9. Hypertrichosis    10. Needs flu shot  refused

## 2017-01-27 LAB — COMPLETE METABOLIC PANEL WITH GFR
ALT: 24 U/L (ref 6–29)
AST: 24 U/L (ref 10–35)
Albumin: 4.5 g/dL (ref 3.6–5.1)
Alkaline Phosphatase: 42 U/L (ref 33–115)
BUN: 12 mg/dL (ref 7–25)
CHLORIDE: 103 mmol/L (ref 98–110)
CO2: 28 mmol/L (ref 20–32)
Calcium: 8.9 mg/dL (ref 8.6–10.2)
Creat: 0.88 mg/dL (ref 0.50–1.10)
GFR, EST NON AFRICAN AMERICAN: 79 mL/min (ref >=60)
Glucose, Bld: 100 mg/dL — ABNORMAL HIGH (ref 65–99)
POTASSIUM: 4.4 mmol/L (ref 3.5–5.3)
SODIUM: 139 mmol/L (ref 135–146)
TOTAL PROTEIN: 7.3 g/dL (ref 6.1–8.1)
Total Bilirubin: 0.4 mg/dL (ref 0.2–1.2)

## 2017-01-27 LAB — TSH: TSH: 0.65 mIU/L

## 2017-01-27 LAB — HEMOGLOBIN A1C
Hgb A1c MFr Bld: 5.9 % — ABNORMAL HIGH (ref <5.7)
Mean Plasma Glucose: 123 mg/dL

## 2017-01-27 LAB — LIPID PANEL
Cholesterol: 165 mg/dL (ref <200)
HDL: 37 mg/dL — ABNORMAL LOW (ref >50)
LDL Cholesterol: 108 mg/dL — ABNORMAL HIGH (ref <100)
Total CHOL/HDL Ratio: 4.5 Ratio (ref <5.0)
Triglycerides: 101 mg/dL (ref <150)
VLDL: 20 mg/dL (ref <30)

## 2017-01-27 LAB — VITAMIN B12: Vitamin B-12: 964 pg/mL (ref 200–1100)

## 2017-01-30 LAB — VITAMIN D 25 HYDROXY (VIT D DEFICIENCY, FRACTURES): Vit D, 25-Hydroxy: 22 ng/mL — ABNORMAL LOW (ref 30–100)

## 2017-01-30 LAB — INSULIN, FASTING: Insulin fasting, serum: 8 u[IU]/mL (ref 2.0–19.6)

## 2018-01-29 ENCOUNTER — Encounter: Payer: Self-pay | Admitting: Family Medicine

## 2018-01-29 ENCOUNTER — Telehealth: Payer: Self-pay | Admitting: Family Medicine

## 2018-01-29 ENCOUNTER — Ambulatory Visit (INDEPENDENT_AMBULATORY_CARE_PROVIDER_SITE_OTHER): Payer: BC Managed Care – PPO | Admitting: Family Medicine

## 2018-01-29 ENCOUNTER — Other Ambulatory Visit (HOSPITAL_COMMUNITY)
Admission: RE | Admit: 2018-01-29 | Discharge: 2018-01-29 | Disposition: A | Discharge status: Home / Self Care | Payer: BC Managed Care – PPO | Source: Ambulatory Visit | Attending: Family Medicine | Admitting: Family Medicine

## 2018-01-29 VITALS — BP 128/70 | HR 54 | Temp 97.6°F | Resp 16 | Ht 64.0 in | Wt 188.3 lb

## 2018-01-29 DIAGNOSIS — E559 Vitamin D deficiency, unspecified: Secondary | ICD-10-CM

## 2018-01-29 DIAGNOSIS — Z1239 Encounter for other screening for malignant neoplasm of breast: Secondary | ICD-10-CM

## 2018-01-29 DIAGNOSIS — E785 Hyperlipidemia, unspecified: Secondary | ICD-10-CM | POA: Diagnosis not present

## 2018-01-29 DIAGNOSIS — Z1211 Encounter for screening for malignant neoplasm of colon: Secondary | ICD-10-CM

## 2018-01-29 DIAGNOSIS — E8881 Metabolic syndrome: Secondary | ICD-10-CM | POA: Diagnosis not present

## 2018-01-29 DIAGNOSIS — Z01419 Encounter for gynecological examination (general) (routine) without abnormal findings: Secondary | ICD-10-CM

## 2018-01-29 DIAGNOSIS — E669 Obesity, unspecified: Secondary | ICD-10-CM

## 2018-01-29 DIAGNOSIS — R7989 Other specified abnormal findings of blood chemistry: Secondary | ICD-10-CM

## 2018-01-29 DIAGNOSIS — E66811 Obesity, class 1: Secondary | ICD-10-CM

## 2018-01-29 DIAGNOSIS — L219 Seborrheic dermatitis, unspecified: Secondary | ICD-10-CM

## 2018-01-29 DIAGNOSIS — Z1231 Encounter for screening mammogram for malignant neoplasm of breast: Secondary | ICD-10-CM

## 2018-01-29 DIAGNOSIS — Z23 Encounter for immunization: Secondary | ICD-10-CM

## 2018-01-29 MED ORDER — CLOBETASOL PROPIONATE 0.05 % EX FOAM: Freq: Two times a day (BID) | CUTANEOUS | 0 refills | Status: DC

## 2018-01-29 MED ORDER — SELENIUM SULFIDE 2.3 % EX SHAM: 5.0000 mL | MEDICATED_SHAMPOO | CUTANEOUS | 2 refills | Status: DC

## 2018-01-29 NOTE — Telephone Encounter (Signed)
Copied from [place] [id number]. Topic: Quick Communication - See Telephone Encounter >> Jan 29, 2018  9:52 AM Nils Flack wrote: CRM for notification. See Telephone encounter for: 01/29/18. The diagnosis on gastro referral is breast cancer screening.  Please advise.

## 2018-01-29 NOTE — Progress Notes (Signed)
Name: Brittney Higgins   MRN: [id number]    DOB: 1971-02-12   Date:01/29/2018       Progress Note  Subjective  Chief Complaint  Chief Complaint  Patient presents with  . Annual Exam    patient sleeps about 7-8 hours per night. tries to eat a well balanced diet and drinks plenty of water  . Labs Only    patient is fasting  . Immunizations    declines flu shot  . Medication Refill    selenium sulfide shampoo & clobetasol 0.05% foam    HPI   Patient presents for annual CPE and follow up   Metabolic Syndrome: she has gained 8 lbs since last visit and eating ice cream on regular basis , not as active lately.  She denies polyphagia, polydipsia or polyuria  Dyslipidemia: reviewed labs,to improve HDL patient needs to eat tree nuts ( pecans/pistachios/almonds ) four times weekly, eat fish two times weekly and exercise at least 150 minutes per week. She will try fish again , she does not like it   Hair loss: she has seen Dermatologist about 90years ago, but she continues to lose hair, she was referred to another dermatologist but was unable to be seen in quickly enough, advised Philippa Chester in [place]   Diet: not eating a healthy lately Exercise: not currently but she   USPSTF grade A and B recommendations    Office Visit from 01/29/2018 in [facility]  AUDIT-C Score  0     Depression:  Depression screen [facility] 2/9 01/29/2018 01/26/2017 01/26/2016 01/08/2015  Decreased Interest 0 0 0 0  Down, Depressed, Hopeless 0 0 0 0  PHQ - 2 Score 0 0 0 0  Altered sleeping 0 - - -  Tired, decreased energy 3 - - -  Change in appetite 1 - - -  Feeling bad or failure about yourself  0 - - -  Trouble concentrating 0 - - -  Moving slowly or fidgety/restless 0 - - -  Suicidal thoughts 0 - - -  PHQ-9 Score 4 - - -  Difficult doing work/chores Not difficult at all - - -   Hypertension: BP Readings from Last 3 Encounters:  01/29/18 128/70  01/26/17 118/72  01/26/16 126/68    Obesity: Wt Readings from Last 3 Encounters:  01/29/18 188 lb 4.8 oz (85.4 kg)  01/26/17 180 lb 2 oz (81.7 kg)  01/26/16 179 lb 8 oz (81.4 kg)   BMI Readings from Last 3 Encounters:  01/29/18 32.32 kg/m  01/26/17 30.92 kg/m  01/26/16 30.81 kg/m     STD testing and prevention (HIV/chl/gon/syphilis): N/A Intimate partner violence: negative screen  Sexual History/Pain during Intercourse: no problems Menstrual History/LMP/Abnormal Bleeding: s/p ablation  Incontinence Symptoms: none   Advanced Care Planning: A voluntary discussion about advance care planning including the explanation and discussion of advance directives.  Discussed health care proxy and Living will, and the patient was able to identify a health care proxy as husband.  Patient does not have a living will at present time.  Breast cancer:  [facility] Mammogram  Date Value Ref Range Status  02/08/2010 normal   Final    BRCA gene screening: N/A Cervical cancer screening: today    Lipids:  Lab Results  Component Value Date   CHOL 165 01/26/2017   CHOL 177 01/03/2016   CHOL 179 01/05/2015   Lab Results  Component Value Date   HDL 37 (L) 01/26/2017   HDL 40 (L)  Name: Brittney Higgins   MRN: [id number]    DOB: May 20, 1971   Date:01/29/2018       Progress Note  Subjective  Chief Complaint  Chief Complaint  Patient presents with  . Annual Exam    patient sleeps about 7-8 hours per night. tries to eat a well balanced diet and drinks plenty of water  . Labs Only    patient is fasting  . Immunizations    declines flu shot  . Medication Refill    selenium sulfide shampoo & clobetasol 0.05% foam    HPI   Patient presents for annual CPE and follow up   Metabolic Syndrome: she has gained 8 lbs since last visit and eating ice cream on regular basis , not as active lately.  She denies polyphagia, polydipsia or polyuria  Dyslipidemia: reviewed labs,to improve HDL patient needs to eat tree nuts ( pecans/pistachios/almonds ) four times weekly, eat fish two times weekly and exercise at least 150 minutes per week. She will try fish again , she does not like it   Hair loss: she has seen Dermatologist about 90years ago, but she continues to lose hair, she was referred to another dermatologist but was unable to be seen in quickly enough, advised Griffith Citron in [place]: not eating a healthy lately Exercise: not currently but she   USPSTF grade A and B recommendations    Office Visit from 01/29/2018 in [facility]  AUDIT-C Score  0     Depression:  Depression screen [facility] 2/9 01/29/2018 01/26/2017 01/26/2016 01/08/2015  Decreased Interest 0 0 0 0  Down, Depressed, Hopeless 0 0 0 0  PHQ - 2 Score 0 0 0 0  Altered sleeping 0 - - -  Tired, decreased energy 3 - - -  Change in appetite 1 - - -  Feeling bad or failure about yourself  0 - - -  Trouble concentrating 0 - - -  Moving slowly or fidgety/restless 0 - - -  Suicidal thoughts 0 - - -  PHQ-9 Score 4 - - -  Difficult doing work/chores Not difficult at all - - -   Hypertension: BP Readings from Last 3 Encounters:  01/29/18 128/70  01/26/17 118/72  01/26/16 126/68    Obesity: Wt Readings from Last 3 Encounters:  01/29/18 188 lb 4.8 oz (85.4 kg)  01/26/17 180 lb 2 oz (81.7 kg)  01/26/16 179 lb 8 oz (81.4 kg)   BMI Readings from Last 3 Encounters:  01/29/18 32.32 kg/m  01/26/17 30.92 kg/m  01/26/16 30.81 kg/m     STD testing and prevention (HIV/chl/gon/syphilis): N/A Intimate partner violence: negative screen  Sexual History/Pain during Intercourse: no problems Menstrual History/LMP/Abnormal Bleeding: s/p ablation  Incontinence Symptoms: none   Advanced Care Planning: A voluntary discussion about advance care planning including the explanation and discussion of advance directives.  Discussed health care proxy and Living will, and the patient was able to identify a health care proxy as husband.  Patient does not have a living will at present time.  Breast cancer:  [facility] Mammogram  Date Value Ref Range Status  02/08/2010 normal   Final    BRCA gene screening: N/A Cervical cancer screening: today    Lipids:  Lab Results  Component Value Date   CHOL 165 01/26/2017   CHOL 177 01/03/2016   CHOL 179 01/05/2015   Lab Results  Component Value Date   HDL 37 (L) 01/26/2017   HDL 40 (L)  Name: Brittney Higgins   MRN: [id number]    DOB: May 20, 1971   Date:01/29/2018       Progress Note  Subjective  Chief Complaint  Chief Complaint  Patient presents with  . Annual Exam    patient sleeps about 7-8 hours per night. tries to eat a well balanced diet and drinks plenty of water  . Labs Only    patient is fasting  . Immunizations    declines flu shot  . Medication Refill    selenium sulfide shampoo & clobetasol 0.05% foam    HPI   Patient presents for annual CPE and follow up   Metabolic Syndrome: she has gained 8 lbs since last visit and eating ice cream on regular basis , not as active lately.  She denies polyphagia, polydipsia or polyuria  Dyslipidemia: reviewed labs,to improve HDL patient needs to eat tree nuts ( pecans/pistachios/almonds ) four times weekly, eat fish two times weekly and exercise at least 150 minutes per week. She will try fish again , she does not like it   Hair loss: she has seen Dermatologist about 90years ago, but she continues to lose hair, she was referred to another dermatologist but was unable to be seen in quickly enough, advised Griffith Citron in [place]: not eating a healthy lately Exercise: not currently but she   USPSTF grade A and B recommendations    Office Visit from 01/29/2018 in [facility]  AUDIT-C Score  0     Depression:  Depression screen [facility] 2/9 01/29/2018 01/26/2017 01/26/2016 01/08/2015  Decreased Interest 0 0 0 0  Down, Depressed, Hopeless 0 0 0 0  PHQ - 2 Score 0 0 0 0  Altered sleeping 0 - - -  Tired, decreased energy 3 - - -  Change in appetite 1 - - -  Feeling bad or failure about yourself  0 - - -  Trouble concentrating 0 - - -  Moving slowly or fidgety/restless 0 - - -  Suicidal thoughts 0 - - -  PHQ-9 Score 4 - - -  Difficult doing work/chores Not difficult at all - - -   Hypertension: BP Readings from Last 3 Encounters:  01/29/18 128/70  01/26/17 118/72  01/26/16 126/68    Obesity: Wt Readings from Last 3 Encounters:  01/29/18 188 lb 4.8 oz (85.4 kg)  01/26/17 180 lb 2 oz (81.7 kg)  01/26/16 179 lb 8 oz (81.4 kg)   BMI Readings from Last 3 Encounters:  01/29/18 32.32 kg/m  01/26/17 30.92 kg/m  01/26/16 30.81 kg/m     STD testing and prevention (HIV/chl/gon/syphilis): N/A Intimate partner violence: negative screen  Sexual History/Pain during Intercourse: no problems Menstrual History/LMP/Abnormal Bleeding: s/p ablation  Incontinence Symptoms: none   Advanced Care Planning: A voluntary discussion about advance care planning including the explanation and discussion of advance directives.  Discussed health care proxy and Living will, and the patient was able to identify a health care proxy as husband.  Patient does not have a living will at present time.  Breast cancer:  [facility] Mammogram  Date Value Ref Range Status  02/08/2010 normal   Final    BRCA gene screening: N/A Cervical cancer screening: today    Lipids:  Lab Results  Component Value Date   CHOL 165 01/26/2017   CHOL 177 01/03/2016   CHOL 179 01/05/2015   Lab Results  Component Value Date   HDL 37 (L) 01/26/2017   HDL 40 (L)  Fall Risk: Fall Risk  01/29/2018 01/26/2017 01/26/2016 01/08/2015  Falls in the past year? No Yes No No  Number falls in past yr: - 1 - -  Injury with Fall? - No - -     Functional Status Survey: Is  the patient deaf or have difficulty hearing?: No Does the patient have difficulty seeing, even when wearing glasses/contacts?: No Does the patient have difficulty concentrating, remembering, or making decisions?: No Does the patient have difficulty walking or climbing stairs?: No Does the patient have difficulty dressing or bathing?: No Does the patient have difficulty doing errands alone such as visiting a doctor's office or shopping?: No   Assessment & Plan  1. Well woman exam  - Cytology - PAP - CBC with Differential/Platelet - COMPLETE METABOLIC PANEL WITH GFR - Lipid panel - Hemoglobin A1c - VITAMIN D 25 Hydroxy (Vit-D Deficiency, Fractures)  2. Metabolic syndrome  - Hemoglobin A1c  3. Breast cancer screening  Mammogram screen   4. Dyslipidemia  - Lipid panel  5. Vitamin D deficiency  Recheck labs  6. Obesity (BMI 30.0-34.9)  Discussed with the patient the risk posed by an increased BMI. Discussed importance of portion control, calorie counting and at least 150 minutes of physical activity weekly. Avoid sweet beverages and drink more water. Eat at least 6 servings of fruit and vegetables daily   7. Needs flu shot  refused  8. Abnormal CBC  - CBC with Differential/Platelet  -USPSTF grade A and B recommendations reviewed with patient; age-appropriate recommendations, preventive care, screening tests, etc discussed and encouraged; healthy living encouraged; see AVS for patient education given to patient -Discussed importance of 150 minutes of physical activity weekly, eat two servings of fish weekly, eat one serving of tree nuts ( cashews, pistachios, pecans, almonds.Marland Kitchen) every other day, eat 6 servings of fruit/vegetables daily and drink plenty of water and avoid sweet beverages.

## 2018-01-29 NOTE — Telephone Encounter (Signed)
After consulting with Dr. Ancil Boozer, referral has been corrected.

## 2018-01-29 NOTE — Patient Instructions (Signed)
Dermatologist [place] 40-64 Years, Female Preventive care refers to lifestyle choices and visits with your health care provider that can promote health and wellness. What does preventive care include?  A yearly physical exam. This is also called an annual well check.  Dental exams once or twice a year.  Routine eye exams. Ask your health care provider how often you should have your eyes checked.  Personal lifestyle choices, including: ? Daily care of your teeth and gums. ? Regular physical activity. ? Eating a healthy diet. ? Avoiding tobacco and drug use. ? Limiting alcohol use. ? Practicing safe sex. ? Taking low-dose aspirin daily starting at age 73. ? Taking vitamin and mineral supplements as recommended by your health care provider. What happens during an annual well check? The services and screenings done by your health care provider during your annual well check will depend on your age, overall health, lifestyle risk factors, and family history of disease. Counseling Your health care provider may ask you questions about your:  Alcohol use.  Tobacco use.  Drug use.  Emotional well-being.  Home and relationship well-being.  Sexual activity.  Eating habits.  Work and work Statistician.  Method of birth control.  Menstrual cycle.  Pregnancy history.  Screening You may have the following tests or measurements:  Height, weight, and BMI.  Blood pressure.  Lipid and cholesterol levels. These may be checked every 5 years, or more frequently if you are over 26 years old.  Skin check.  Lung cancer screening. You may have this screening every year starting at age 30 if you have a 30-pack-year history of smoking and currently smoke or have quit within the past 15 years.  Fecal occult blood test (FOBT) of the stool. You may have this test every year starting at age 26.  Flexible sigmoidoscopy or colonoscopy. You may have a  sigmoidoscopy every 5 years or a colonoscopy every 10 years starting at age 17.  Hepatitis C blood test.  Hepatitis B blood test.  Sexually transmitted disease (STD) testing.  Diabetes screening. This is done by checking your blood sugar (glucose) after you have not eaten for a while (fasting). You may have this done every 1-3 years.  Mammogram. This may be done every 1-2 years. Talk to your health care provider about when you should start having regular mammograms. This may depend on whether you have a family history of breast cancer.  BRCA-related cancer screening. This may be done if you have a family history of breast, ovarian, tubal, or peritoneal cancers.  Pelvic exam and Pap test. This may be done every 3 years starting at age 40. Starting at age 65, this may be done every 5 years if you have a Pap test in combination with an HPV test.  Bone density scan. This is done to screen for osteoporosis. You may have this scan if you are at high risk for osteoporosis.  Discuss your test results, treatment options, and if necessary, the need for more tests with your health care provider. Vaccines Your health care provider may recommend certain vaccines, such as:  Influenza vaccine. This is recommended every year.  Tetanus, diphtheria, and acellular pertussis (Tdap, Td) vaccine. You may need a Td booster every 10 years.  Varicella vaccine. You may need this if you have not been vaccinated.  Zoster vaccine. You may need this after age 62.  Measles, mumps, and rubella (MMR) vaccine. You may need at least one dose of  MMR if you were born in 1957 or later. You may also need a second dose.  Pneumococcal 13-valent conjugate (PCV13) vaccine. You may need this if you have certain conditions and were not previously vaccinated.  Pneumococcal polysaccharide (PPSV23) vaccine. You may need one or two doses if you smoke cigarettes or if you have certain conditions.  Meningococcal vaccine. You may  need this if you have certain conditions.  Hepatitis A vaccine. You may need this if you have certain conditions or if you travel or work in places where you may be exposed to hepatitis A.  Hepatitis B vaccine. You may need this if you have certain conditions or if you travel or work in places where you may be exposed to hepatitis B.  Haemophilus influenzae type b (Hib) vaccine. You may need this if you have certain conditions.  Talk to your health care provider about which screenings and vaccines you need and how often you need them. This information is not intended to replace advice given to you by your health care provider. Make sure you discuss any questions you have with your health care provider. Document Released: 06/11/2015 Document Revised: 02/02/2016 Document Reviewed: 03/16/2015 Elsevier Interactive Patient Education  [employer].

## 2018-01-30 LAB — CBC WITH DIFFERENTIAL/PLATELET
BASOS ABS: 41 {cells}/uL (ref 0–200)
Basophils Relative: 0.7 %
Eosinophils Absolute: 201 cells/uL (ref 15–500)
Eosinophils Relative: 3.4 %
HCT: 41.8 % (ref 35.0–45.0)
Hemoglobin: 13 g/dL (ref 11.7–15.5)
Lymphs Abs: 2543 cells/uL (ref 850–3900)
MCH: 22 pg — AB (ref 27.0–33.0)
MCHC: 31.1 g/dL — AB (ref 32.0–36.0)
MCV: 70.8 fL — ABNORMAL LOW (ref 80.0–100.0)
MONOS PCT: 9.2 %
MPV: 11.2 fL (ref 7.5–12.5)
Neutro Abs: 2572 cells/uL (ref 1500–7800)
Neutrophils Relative %: 43.6 %
PLATELETS: 293 10*3/uL (ref 140–400)
RBC: 5.9 10*6/uL — ABNORMAL HIGH (ref 3.80–5.10)
RDW: 14.5 % (ref 11.0–15.0)
TOTAL LYMPHOCYTE: 43.1 %
WBC mixed population: 543 cells/uL (ref 200–950)
WBC: 5.9 10*3/uL (ref 3.8–10.8)

## 2018-01-30 LAB — COMPLETE METABOLIC PANEL WITH GFR
AG Ratio: 1.5 (calc) (ref 1.0–2.5)
ALBUMIN MSPROF: 4.5 g/dL (ref 3.6–5.1)
ALKALINE PHOSPHATASE (APISO): 49 U/L (ref 33–115)
ALT: 19 U/L (ref 6–29)
AST: 20 U/L (ref 10–35)
BILIRUBIN TOTAL: 0.4 mg/dL (ref 0.2–1.2)
BUN: 12 mg/dL (ref 7–25)
CHLORIDE: 102 mmol/L (ref 98–110)
CO2: 28 mmol/L (ref 20–32)
Calcium: 9 mg/dL (ref 8.6–10.2)
Creat: 0.99 mg/dL (ref 0.50–1.10)
GFR, EST NON AFRICAN AMERICAN: 68 mL/min/{1.73_m2} (ref >=60)
GFR, Est African American: 79 mL/min/{1.73_m2} (ref >=60)
GLUCOSE: 94 mg/dL (ref 65–99)
Globulin: 3.1 g/dL (calc) (ref 1.9–3.7)
POTASSIUM: 4.1 mmol/L (ref 3.5–5.3)
SODIUM: 138 mmol/L (ref 135–146)
Total Protein: 7.6 g/dL (ref 6.1–8.1)

## 2018-01-30 LAB — HEMOGLOBIN A1C
EAG (MMOL/L): 7.1 (calc)
Hgb A1c MFr Bld: 6.1 % of total Hgb — ABNORMAL HIGH (ref <5.7)
MEAN PLASMA GLUCOSE: 128 (calc)

## 2018-01-30 LAB — VITAMIN D 25 HYDROXY (VIT D DEFICIENCY, FRACTURES): VIT D 25 HYDROXY: 25 ng/mL — AB (ref 30–100)

## 2018-01-30 LAB — LIPID PANEL
CHOLESTEROL: 197 mg/dL (ref <200)
HDL: 42 mg/dL — AB (ref >50)
LDL Cholesterol (Calc): 126 mg/dL (calc) — ABNORMAL HIGH
NON-HDL CHOLESTEROL (CALC): 155 mg/dL — AB (ref <130)
TRIGLYCERIDES: 176 mg/dL — AB (ref <150)
Total CHOL/HDL Ratio: 4.7 (calc) (ref <5.0)

## 2018-01-30 LAB — CYTOLOGY - PAP
Diagnosis: NEGATIVE
HPV: NOT DETECTED

## 2018-02-18 ENCOUNTER — Ambulatory Visit
Admission: RE | Admit: 2018-02-18 | Discharge: 2018-02-18 | Disposition: A | Discharge status: Home / Self Care | Payer: BC Managed Care – PPO | Source: Ambulatory Visit | Attending: Family Medicine | Admitting: Family Medicine

## 2018-02-18 DIAGNOSIS — Z1231 Encounter for screening mammogram for malignant neoplasm of breast: Secondary | ICD-10-CM | POA: Insufficient documentation

## 2018-02-18 DIAGNOSIS — Z1239 Encounter for other screening for malignant neoplasm of breast: Secondary | ICD-10-CM

## 2018-02-19 ENCOUNTER — Encounter: Payer: Self-pay | Admitting: *Deleted

## 2018-04-15 ENCOUNTER — Other Ambulatory Visit: Payer: Self-pay | Admitting: Family Medicine

## 2018-04-15 DIAGNOSIS — Z01419 Encounter for gynecological examination (general) (routine) without abnormal findings: Secondary | ICD-10-CM

## 2018-04-15 DIAGNOSIS — E559 Vitamin D deficiency, unspecified: Secondary | ICD-10-CM

## 2018-04-30 ENCOUNTER — Ambulatory Visit: Payer: Self-pay | Admitting: *Deleted

## 2018-04-30 NOTE — Telephone Encounter (Signed)
Patient called in with a question about Prednisone. She says that she hurt her ankle on the job and was sent to [facility] for [employer] on last Friday. She says she was prescribed Prednisone to take, but didn't take it because of all she's read about it. She says she wants to hear from Dr. Ancil Boozer is it ok to take with all of her medical conditions. I advised I will send this to Dr. Ancil Boozer and someone will call with her recommendation.  Reason for Disposition . Caller has medication question about med not prescribed by PCP and triager unable to answer question (e.g., compatibility with other med, storage)  Protocols used: MEDICATION QUESTION CALL-A-AH

## 2018-05-01 NOTE — Telephone Encounter (Signed)
She needs to take it with food and last dose by 5 pm so it does not affect her sleep

## 2018-05-01 NOTE — Telephone Encounter (Signed)
Unable to leave voicemail due to no voicemail set up.

## 2018-05-23 ENCOUNTER — Ambulatory Visit: Payer: BC Managed Care – PPO | Admitting: Nurse Practitioner

## 2018-05-23 ENCOUNTER — Encounter: Payer: Self-pay | Admitting: Nurse Practitioner

## 2018-05-23 VITALS — BP 122/72 | HR 95 | Temp 100.5°F | Resp 18 | Ht 64.0 in | Wt 190.5 lb

## 2018-05-23 DIAGNOSIS — R6889 Other general symptoms and signs: Secondary | ICD-10-CM | POA: Diagnosis not present

## 2018-05-23 LAB — POCT INFLUENZA A/B
Influenza A, POC: POSITIVE — AB
Influenza B, POC: NEGATIVE

## 2018-05-23 MED ORDER — BENZONATATE 200 MG PO CAPS: 200.0000 mg | ORAL_CAPSULE | Freq: Two times a day (BID) | ORAL | 0 refills | Status: DC | PRN

## 2018-05-23 MED ORDER — OSELTAMIVIR PHOSPHATE 75 MG PO CAPS: 75.0000 mg | ORAL_CAPSULE | Freq: Two times a day (BID) | ORAL | 0 refills | Status: DC

## 2018-05-23 NOTE — Patient Instructions (Addendum)
-   Take tamiflu twice a day to help decrease the length of your illness - Take cough medicine (tessalon perls) if needed for coughing  Your body is so smart and strong that it will be fighting this illness off for you but it is important that you drink plenty of fluids, rest. Cover your nose/mouth when you cough or sneeze and wash your hands well and often. Here are some helpful things you can use or pick up over the counter from the pharmacy to help with your symptoms:   For Fever/Pain: Acetaminophen every 6 hours as needed (maximum of 3000mg  a day). If you are still uncomfortable you can add ibuprofen OR naproxen  For coughing: try dextromethorphan for a cough suppressant, and/or a cool mist humidifier, lozenges  For sore throat: saline gargles, honey herbal tea, lozenges, throat spray  To dry out your nose: try an antihistamine like loratadine (non-sedating) or diphenhydramine (sedating) or others To relieve a stuffy nose: try an oral decongestant  Like pseudoephedrine if you are under the age of 73 and do not have high blood pressure, neti pot To make blowing your nose easier: guaifenesin (musinex)   - Avoid being around others, if you develop shortness of breath, severe facial pain, fevers or chills that are not controlled with tylenol and ibuprofen please come in to be re-evaluated or go to urgent care or the ER.   RIGHT EAR, ear wax build up  For earwax build up use ONE of these over the counter options to soften up with ear wax in your ear: mineral oil drops, docusate (liquid), debrox. Place a 2 drops in your ear while laying on your left side- Keep laying for 30-60 minutes on that side after application. Can use once daily to soften ear wax. Wash in shower afterwards,  . do not stick anything in your ear as it further impact the ear wax

## 2018-05-23 NOTE — Progress Notes (Signed)
Name: Brittney Higgins   MRN: [id number]    DOB: 06-16-1970   Date:05/23/2018       Progress Note  Subjective  Chief Complaint  Chief Complaint  Patient presents with  . Influenza    HPI  Patient reports flu like symptoms started yesterday- body aches, cough, headache, fatigue. Patient took nightime theraflu last night. Denies shortness of breath, fever, chills.   Patient Active Problem List   Diagnosis Date Noted  . Depression 01/26/2016  . Seborrheic dermatitis of scalp 01/08/2015  . Metabolic syndrome 01/08/2015  . Lipoma of arm 01/08/2015  . History of anemia 01/01/2015  . Dyslipidemia 01/01/2015  . Irregular bleeding 01/01/2015  . Obesity (BMI 30.0-34.9) 01/01/2015  . Vitamin D deficiency 01/01/2015  . Chronic constipation 01/01/2015  . Fibroid 01/01/2015  . Bunion 01/01/2015  . Abnormal CBC 01/01/2015    Past Medical History:  Diagnosis Date  . Anemia 2012   3years of iron meds  . Hyperlipidemia   . Medical history non-contributory   . Obesity   . Vaginal delivery 2001    Past Surgical History:  Procedure Laterality Date  . BREAST CYST ASPIRATION  age 47  . DILITATION & CURRETTAGE/HYSTROSCOPY WITH NOVASURE ABLATION N/A 06/04/2013   Procedure: DILATATION & CURETTAGE/HYSTEROSCOPY WITH NOVASURE ABLATION WITH ATTEMPTED VERSAPOINT;  Surgeon: Lenoard Aden, MD;  Location: [facility];  Service: Gynecology;  Laterality: N/A;    Social History   Tobacco Use  . Smoking status: Never Smoker  . Smokeless tobacco: Never Used  Substance Use Topics  . Alcohol use: No    Alcohol/week: 0.0 standard drinks     Current Outpatient Medications:  .  clobetasol (OLUX) 0.05 % topical foam, Apply topically 2 (two) times daily., Disp: 50 g, Rfl: 0 .  Selenium Sulfide 2.3 % SHAM, Apply 5 mLs topically 3 (three) times a week., Disp: 180 mL, Rfl: 2 .  Vitamin D, Ergocalciferol, (DRISDOL) 1.25 MG (50000 UT) CAPS capsule, TAKE 1 CAPSULE EVERY 14 DAYS, Disp: 6 capsule, Rfl: 7  No Known  Allergies  ROS   No other specific complaints in a complete review of systems (except as listed in HPI above).  Objective  Vitals:   05/23/18 1137  BP: 122/72  Pulse: 95  Resp: 18  Temp: (!) 100.5 F (38.1 C)  TempSrc: Oral  SpO2: 98%  Weight: 190 lb 8 oz (86.4 kg)  Height: 5\' 4"  (1.626 m)    Body mass index is 32.7 kg/m.  Nursing Note and Vital Signs reviewed.  Physical Exam HENT:     Head: Normocephalic and atraumatic.     Right Ear: There is impacted cerumen.     Left Ear: Hearing, tympanic membrane, ear canal and external ear normal.     Nose: Nose normal.     Right Sinus: No maxillary sinus tenderness or frontal sinus tenderness.     Left Sinus: No maxillary sinus tenderness or frontal sinus tenderness.     Mouth/Throat:     Pharynx: Oropharynx is clear. Uvula midline. No oropharyngeal exudate.  Eyes:     General:        Right eye: No discharge.        Left eye: No discharge.     Conjunctiva/sclera: Conjunctivae normal.  Neck:     Musculoskeletal: Normal range of motion.  Cardiovascular:     Rate and Rhythm: Normal rate and regular rhythm.  Pulmonary:     Effort: Pulmonary effort is normal.     Breath  sounds: Normal breath sounds.  Abdominal:     Palpations: Abdomen is soft.     Tenderness: There is no abdominal tenderness.  Lymphadenopathy:     Cervical: No cervical adenopathy.  Skin:    General: Skin is warm and dry.     Findings: No rash.  Neurological:     Mental Status: She is alert.  Psychiatric:        Judgment: Judgment normal.      No results found for this or any previous visit (from the past 48 hour(s)).  Assessment & Plan  1. Flu-like symptoms Positive Flu A. Please see AVS for further instructions provided.  - POCT Influenza A/B - oseltamivir (TAMIFLU) 75 MG capsule; Take 1 capsule (75 mg total) by mouth 2 (two) times daily.  Dispense: 10 capsule; Refill: 0 - benzonatate (TESSALON) 200 MG capsule; Take 1 capsule (200 mg total)  by mouth 2 (two) times daily as needed for cough.  Dispense: 20 capsule; Refill: 0

## 2019-02-04 ENCOUNTER — Encounter: Payer: Self-pay | Admitting: Family Medicine

## 2019-02-04 ENCOUNTER — Ambulatory Visit (INDEPENDENT_AMBULATORY_CARE_PROVIDER_SITE_OTHER): Payer: BC Managed Care – PPO | Admitting: Family Medicine

## 2019-02-04 ENCOUNTER — Telehealth: Payer: Self-pay

## 2019-02-04 ENCOUNTER — Other Ambulatory Visit: Payer: Self-pay

## 2019-02-04 VITALS — BP 120/58 | HR 65 | Temp 96.8°F | Resp 16 | Ht 63.0 in | Wt 190.6 lb

## 2019-02-04 DIAGNOSIS — E8881 Metabolic syndrome: Secondary | ICD-10-CM

## 2019-02-04 DIAGNOSIS — Z8371 Family history of colonic polyps: Secondary | ICD-10-CM

## 2019-02-04 DIAGNOSIS — Z1211 Encounter for screening for malignant neoplasm of colon: Secondary | ICD-10-CM

## 2019-02-04 DIAGNOSIS — Z0000 Encounter for general adult medical examination without abnormal findings: Secondary | ICD-10-CM | POA: Diagnosis not present

## 2019-02-04 DIAGNOSIS — E785 Hyperlipidemia, unspecified: Secondary | ICD-10-CM | POA: Diagnosis not present

## 2019-02-04 DIAGNOSIS — Z1239 Encounter for other screening for malignant neoplasm of breast: Secondary | ICD-10-CM | POA: Diagnosis not present

## 2019-02-04 DIAGNOSIS — F4321 Adjustment disorder with depressed mood: Secondary | ICD-10-CM

## 2019-02-04 DIAGNOSIS — Z23 Encounter for immunization: Secondary | ICD-10-CM

## 2019-02-04 DIAGNOSIS — E559 Vitamin D deficiency, unspecified: Secondary | ICD-10-CM

## 2019-02-04 DIAGNOSIS — R198 Other specified symptoms and signs involving the digestive system and abdomen: Secondary | ICD-10-CM

## 2019-02-04 DIAGNOSIS — Z1159 Encounter for screening for other viral diseases: Secondary | ICD-10-CM

## 2019-02-04 MED ORDER — NA SULFATE-K SULFATE-MG SULF 17.5-3.13-1.6 GM/177ML PO SOLN: 1.0000 | Freq: Once | ORAL | 0 refills | Status: AC

## 2019-02-04 NOTE — Progress Notes (Signed)
Name: Brittney Higgins   MRN: [id number]    DOB: [birth year]   Date:02/04/2019       Progress Note  Subjective  Chief Complaint  Chief Complaint  Patient presents with  . Annual Exam    HPI  Patient presents for annual CPE.  She is under a lot of stress, new principal at the school, it is affecting her health, she has noticed intermittent sharp chest pain that lasts less than one minute, some crying spells, does not like going to work, also having rectal pain that has woken her up at night, no blood in stools, change in bowel movements of straining  Dyslipidemia: we will recheck labs today  Metabolic syndrome: last A1C was over 6%, discussed life style modifications, she states that it has been hard to take care of her health while working being stressed. Denies polyphagia, polyuria or polydipsia   Diet: tried to eat healthy    USPSTF grade A and B recommendations    Office Visit from 02/04/2019 in [facility]  AUDIT-C Score  0     Depression: Phq 9 is  negative Depression screen [facility] 2/9 02/04/2019 05/23/2018 01/29/2018 01/26/2017 01/26/2016  Decreased Interest 0 0 0 0 0  Down, Depressed, Hopeless 0 0 0 0 0  PHQ - 2 Score 0 0 0 0 0  Altered sleeping 0 0 0 - -  Tired, decreased energy 0 0 3 - -  Change in appetite 0 0 1 - -  Feeling bad or failure about yourself  0 0 0 - -  Trouble concentrating 0 0 0 - -  Moving slowly or fidgety/restless 0 0 0 - -  Suicidal thoughts 0 0 0 - -  PHQ-9 Score 0 0 4 - -  Difficult doing work/chores - - Not difficult at all - -   Hypertension: BP Readings from Last 3 Encounters:  02/04/19 (!) 120/58  05/23/18 122/72  01/29/18 128/70   Obesity: Wt Readings from Last 3 Encounters:  02/04/19 190 lb 9.6 oz (86.5 kg)  05/23/18 190 lb 8 oz (86.4 kg)  01/29/18 188 lb 4.8 oz (85.4 kg)   BMI Readings from Last 3 Encounters:  02/04/19 33.76 kg/m  05/23/18 32.70 kg/m  01/29/18 32.32 kg/m     Hep C Screening: today  STD testing  and prevention (HIV/chl/gon/syphilis): N/A Intimate partner violence:negative screen Sexual History/Pain during Intercourse: no pain  Menstrual History/LMP/Abnormal Bleeding: no cycles for years, since an ablation  Incontinence Symptoms: no symptoms   Breast cancer:  - Last Mammogram: due in September - BRCA gene screening: N/A  Osteoporosis Prevention: discussed high calcium and vitamin D diet   Cervical cancer screening: repeat in 2019   Skin cancer: discussed atypical moles Colorectal cancer: we will place referral  Lung cancer:   Low Dose CT Chest recommended if Age 13-80 years, 30 pack-year currently smoking OR have quit w/in 15years. Patient does not qualify.   ECG: N/A  Advanced Care Planning: A voluntary discussion about advance care planning including the explanation and discussion of advance directives.  Discussed health care proxy and Living will, and the patient was able to identify a health care proxy as husband.  Patient does not have a living will at present time. If patient does have living will, I have requested they bring this to the clinic to be scanned in to their chart.  Lipids: Lab Results  Component Value Date   CHOL 197 01/29/2018   CHOL 165 01/26/2017  CHOL 177 01/03/2016   Lab Results  Component Value Date   HDL 42 (L) 01/29/2018   HDL 37 (L) 01/26/2017   HDL 40 (L) 01/03/2016   Lab Results  Component Value Date   LDLCALC 126 (H) 01/29/2018   LDLCALC 108 (H) 01/26/2017   LDLCALC 110 01/03/2016   Lab Results  Component Value Date   TRIG 176 (H) 01/29/2018   TRIG 101 01/26/2017   TRIG 136 01/03/2016   Lab Results  Component Value Date   CHOLHDL 4.7 01/29/2018   CHOLHDL 4.5 01/26/2017   CHOLHDL 4.4 01/03/2016   No results found for: LDLDIRECT  Glucose: Glucose, Bld  Date Value Ref Range Status  01/29/2018 94 65 - 99 mg/dL Final    Comment:    .            Fasting reference interval .   01/26/2017 100 (H) 65 - 99 mg/dL Final   84/13/2440 102 (H) 65 - 99 mg/dL Final    Patient Active Problem List   Diagnosis Date Noted  . Depression 01/26/2016  . Seborrheic dermatitis of scalp 01/08/2015  . Metabolic syndrome 01/08/2015  . Lipoma of arm 01/08/2015  . History of anemia 01/01/2015  . Dyslipidemia 01/01/2015  . Irregular bleeding 01/01/2015  . Obesity (BMI 30.0-34.9) 01/01/2015  . Vitamin D deficiency 01/01/2015  . Chronic constipation 01/01/2015  . Fibroid 01/01/2015  . Bunion 01/01/2015  . Abnormal CBC 01/01/2015    Past Surgical History:  Procedure Laterality Date  . BREAST CYST ASPIRATION  age 36  . DILITATION & CURRETTAGE/HYSTROSCOPY WITH NOVASURE ABLATION N/A 06/04/2013   Procedure: DILATATION & CURETTAGE/HYSTEROSCOPY WITH NOVASURE ABLATION WITH ATTEMPTED VERSAPOINT;  Surgeon: Lenoard Aden, MD;  Location: [facility];  Service: Gynecology;  Laterality: N/A;    Family History  Problem Relation Age of Onset  . Hypertension Mother   . COPD Mother   . Colon polyps Father   . Heart disease Father   . CAD Father   . AAA (abdominal aortic aneurysm) Father   . Diabetes Father   . Dementia Father   . Other Father        Kidney Failure  . Hypertension Sister   . Diabetes Sister   . Sleep apnea Sister   . Asthma Son   . Diabetes Sister   . Diabetes Sister   . Breast cancer Neg Hx     Social History   Socioeconomic History  . Marital status: Married    Spouse name: Onalee Hua  . Number of children: 1  . Years of education: Not on file  . Highest education level: Master's degree (e.g., MA, MS, MEng, MEd, MSW, MBA)  Occupational History  . Occupation: assistance principal     Employer: [employer]    Comment: [employer]  Social Needs  . Financial resource strain: Not hard at all  . Food insecurity    Worry: Never true    Inability: Never true  . Transportation needs    Medical: No    Non-medical: No  Tobacco Use  . Smoking status: Never Smoker  . Smokeless tobacco: Never Used  Substance  and Sexual Activity  . Alcohol use: No    Alcohol/week: 0.0 standard drinks  . Drug use: No  . Sexual activity: Yes    Partners: Male    Birth control/protection: None  Lifestyle  . Physical activity    Days per week: 0 days    Minutes per session: 0 min  . Stress:  Rather much  Relationships  . Social connections    Talks on phone: More than three times a week    Gets together: Once a week    Attends religious service: More than 4 times per year    Active member of club or organization: No    Attends meetings of clubs or organizations: Never    Relationship status: Married  . Intimate partner violence    Fear of current or ex partner: No    Emotionally abused: No    Physically abused: No    Forced sexual activity: No  Other Topics Concern  . 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  clobetasol (OLUX) 0.05 % topical foam, Apply topically 2 (two) times daily., Disp: 50 g, Rfl: 0 .  Selenium Sulfide 2.3 % SHAM, Apply 5 mLs topically 3 (three) times a week., Disp: 180 mL, Rfl: 2 .  Vitamin D, Ergocalciferol, (DRISDOL) 1.25 MG (50000 UT) CAPS capsule, TAKE 1 CAPSULE EVERY 14 DAYS, Disp: 6 capsule, Rfl: 7  No Known Allergies   ROS  Constitutional: Negative for fever or weight change.  Respiratory: Negative for cough and shortness of breath.   Cardiovascular: Negative for chest pain or palpitations.  Gastrointestinal: Negative for abdominal pain, no bowel changes.  Musculoskeletal: Negative for gait problem or joint swelling.  Skin: Negative for rash.  Neurological: Negative for dizziness or headache.  No other specific complaints in a complete review of systems (except as listed in HPI above).  Objective  Vitals:   02/04/19 0815  BP: (!) 120/58  Pulse: 65  Resp: 16  Temp: (!) 96.8 F (36 C)  TempSrc: Temporal  SpO2: 96%  Weight: 190 lb 9.6 oz (86.5 kg)  Height: 5\' 3"  (1.6 m)     Body mass index is 33.76 kg/m.  Physical Exam  Constitutional: Patient appears well-developed and well-nourished. No distress.  HENT: Head: Normocephalic and atraumatic. Ears: B TMs ok, no erythema or effusion; Nose: Nose normal. Mouth/Throat: Oropharynx is clear and moist. No oropharyngeal exudate.  Eyes: Conjunctivae and EOM are normal. Pupils are equal, round, and reactive to light. No scleral icterus.  Neck: Normal range of motion. Neck supple. No JVD present. No thyromegaly present.  Cardiovascular: Normal rate, regular rhythm and normal heart sounds.  No murmur heard. No BLE edema. Pulmonary/Chest: Effort normal and breath sounds normal. No respiratory distress. Abdominal: Soft. Bowel sounds are normal, no distension. There is no tenderness. no masses Breast: no lumps or masses, no nipple discharge or rashes FEMALE GENITALIA:  External genitalia normal RECTAL: no rectal masses or hemorrhoids Musculoskeletal: Normal range of motion, no joint effusions. No gross deformities Neurological: he is alert and oriented to person, place, and time. No cranial nerve deficit. Coordination, balance, strength, speech and gait are normal.  Skin: Skin is warm and dry. No rash noted. No erythema.  Psychiatric: Patient has a normal mood and affect. behavior is normal. Judgment and thought content normal.  Fall Risk: Fall Risk  02/04/2019 05/23/2018 01/29/2018 01/26/2017 01/26/2016  Falls in the past year? 0 1 No Yes No  Number falls in past yr: 0 0 - 1 -  Injury with Fall? 0 1 - No -   Functional Status Survey: Is the patient deaf or have difficulty hearing?: No Does the patient have difficulty seeing, even when wearing glasses/contacts?: No Does the patient have difficulty concentrating, remembering,  or making decisions?: No Does the patient have difficulty walking or climbing stairs?: No Does the patient have difficulty dressing or bathing?: No Does the patient have difficulty doing errands  alone such as visiting a doctor's office or shopping?: No   Assessment & Plan  1. Well adult exam  - CBC with Differential/Platelet - COMPLETE METABOLIC PANEL WITH GFR - Hepatitis C antibody - Hemoglobin A1c - VITAMIN D 25 Hydroxy (Vit-D Deficiency, Fractures) - Lipid panel - MM 3D SCREEN BREAST BILATERAL; Future - Ambulatory referral to Gastroenterology  2. Colon cancer screening  - CBC with Differential/Platelet - Ambulatory referral to Gastroenterology  3. Breast cancer screening  - MM 3D SCREEN BREAST BILATERAL; Future  4. Dyslipidemia  - Lipid panel  5. Metabolic syndrome  - COMPLETE METABOLIC PANEL WITH GFR - Hemoglobin A1c  6. Vitamin D deficiency  - VITAMIN D 25 Hydroxy (Vit-D Deficiency, Fractures)  7. Situational depression  We will give her [facility] number, she will try counseling first   8. [place]   9. Need for hepatitis C screening test  - Hepatitis C antibody  -USPSTF grade A and B recommendations reviewed with patient; age-appropriate recommendations, preventive care, screening tests, etc discussed and encouraged; healthy living encouraged; see AVS for patient education given to patient -Discussed importance of 150 minutes of physical activity weekly, eat two servings of fish weekly, eat one serving of tree nuts ( cashews, pistachios, pecans, almonds.Marland Kitchen) every other day, eat 6 servings of fruit/vegetables daily and drink plenty of water and avoid sweet beverages.

## 2019-02-04 NOTE — Telephone Encounter (Signed)
Gastroenterology Pre-Procedure Review  Request Date: 02/18/19 Requesting Physician: Dr. Allen Norris  PATIENT REVIEW QUESTIONS: The patient responded to the following health history questions as indicated:    1. Are you having any GI issues? no 2. Do you have a personal history of Polyps? no 3. Do you have a family history of Colon Cancer or Polyps? yes (Father polyps) 4. Diabetes Mellitus? no 5. Joint replacements in the past 12 months?no 6. Major health problems in the past 3 months?no 7. Any artificial heart valves, MVP, or defibrillator?no    MEDICATIONS & ALLERGIES:    Patient reports the following regarding taking any anticoagulation/antiplatelet therapy:   Plavix, Coumadin, Eliquis, Xarelto, Lovenox, Pradaxa, Brilinta, or Effient? no Aspirin? no  Patient confirms/reports the following medications:  Current Outpatient Medications  Medication Sig Dispense Refill  . clobetasol (OLUX) 0.05 % topical foam Apply topically 2 (two) times daily. 50 g 0  . Selenium Sulfide 2.3 % SHAM Apply 5 mLs topically 3 (three) times a week. 180 mL 2  . Vitamin D, Ergocalciferol, (DRISDOL) 1.25 MG (50000 UT) CAPS capsule TAKE 1 CAPSULE EVERY 14 DAYS 6 capsule 7   No current facility-administered medications for this visit.     Patient confirms/reports the following allergies:  No Known Allergies  No orders of the defined types were placed in this encounter.   AUTHORIZATION INFORMATION Primary Insurance: 1D#: Group #:  Secondary Insurance: 1D#: Group #:  SCHEDULE INFORMATION: Date: 02/18/2019 Time: Location: [place]

## 2019-02-05 LAB — CBC WITH DIFFERENTIAL/PLATELET
Absolute Monocytes: 540 cells/uL (ref 200–950)
Basophils Absolute: 50 cells/uL (ref 0–200)
Basophils Relative: 0.7 %
Eosinophils Absolute: 338 cells/uL (ref 15–500)
Eosinophils Relative: 4.7 %
HCT: 44.9 % (ref 35.0–45.0)
Hemoglobin: 13.9 g/dL (ref 11.7–15.5)
Lymphs Abs: 2218 cells/uL (ref 850–3900)
MCH: 22.1 pg — ABNORMAL LOW (ref 27.0–33.0)
MCHC: 31 g/dL — ABNORMAL LOW (ref 32.0–36.0)
MCV: 71.5 fL — ABNORMAL LOW (ref 80.0–100.0)
MPV: 11.2 fL (ref 7.5–12.5)
Monocytes Relative: 7.5 %
Neutro Abs: 4054 cells/uL (ref 1500–7800)
Neutrophils Relative %: 56.3 %
Platelets: 303 10*3/uL (ref 140–400)
RBC: 6.28 10*6/uL — ABNORMAL HIGH (ref 3.80–5.10)
RDW: 15.3 % — ABNORMAL HIGH (ref 11.0–15.0)
Total Lymphocyte: 30.8 %
WBC: 7.2 10*3/uL (ref 3.8–10.8)

## 2019-02-05 LAB — COMPLETE METABOLIC PANEL WITH GFR
AG Ratio: 1.4 (calc) (ref 1.0–2.5)
ALT: 29 U/L (ref 6–29)
AST: 28 U/L (ref 10–35)
Albumin: 4.6 g/dL (ref 3.6–5.1)
Alkaline phosphatase (APISO): 47 U/L (ref 31–125)
BUN: 12 mg/dL (ref 7–25)
CO2: 23 mmol/L (ref 20–32)
Calcium: 9 mg/dL (ref 8.6–10.2)
Chloride: 101 mmol/L (ref 98–110)
Creat: 0.88 mg/dL (ref 0.50–1.10)
GFR, Est African American: 90 mL/min/{1.73_m2} (ref >=60)
GFR, Est Non African American: 78 mL/min/{1.73_m2} (ref >=60)
Globulin: 3.3 g/dL (calc) (ref 1.9–3.7)
Glucose, Bld: 114 mg/dL — ABNORMAL HIGH (ref 65–99)
Potassium: 3.8 mmol/L (ref 3.5–5.3)
Sodium: 138 mmol/L (ref 135–146)
Total Bilirubin: 0.6 mg/dL (ref 0.2–1.2)
Total Protein: 7.9 g/dL (ref 6.1–8.1)

## 2019-02-05 LAB — LIPID PANEL
Cholesterol: 211 mg/dL — ABNORMAL HIGH (ref <200)
HDL: 41 mg/dL — ABNORMAL LOW (ref >=50)
LDL Cholesterol (Calc): 135 mg/dL (calc) — ABNORMAL HIGH
Non-HDL Cholesterol (Calc): 170 mg/dL (calc) — ABNORMAL HIGH (ref <130)
Total CHOL/HDL Ratio: 5.1 (calc) — ABNORMAL HIGH (ref <5.0)
Triglycerides: 210 mg/dL — ABNORMAL HIGH (ref <150)

## 2019-02-05 LAB — HEMOGLOBIN A1C
Hgb A1c MFr Bld: 6.3 % of total Hgb — ABNORMAL HIGH (ref <5.7)
Mean Plasma Glucose: 134 (calc)
eAG (mmol/L): 7.4 (calc)

## 2019-02-05 LAB — HEPATITIS C ANTIBODY
Hepatitis C Ab: NONREACTIVE
SIGNAL TO CUT-OFF: 0.01 (ref <1.00)

## 2019-02-05 LAB — VITAMIN D 25 HYDROXY (VIT D DEFICIENCY, FRACTURES): Vit D, 25-Hydroxy: 30 ng/mL (ref 30–100)

## 2019-02-14 ENCOUNTER — Encounter: Payer: Self-pay | Admitting: Family Medicine

## 2019-02-14 ENCOUNTER — Other Ambulatory Visit
Admission: RE | Admit: 2019-02-14 | Discharge: 2019-02-14 | Disposition: A | Discharge status: Home / Self Care | Payer: BC Managed Care – PPO | Source: Ambulatory Visit | Attending: Gastroenterology | Admitting: Gastroenterology

## 2019-02-14 ENCOUNTER — Other Ambulatory Visit: Payer: Self-pay | Admitting: Family Medicine

## 2019-02-14 ENCOUNTER — Telehealth: Payer: Self-pay

## 2019-02-14 ENCOUNTER — Other Ambulatory Visit: Payer: Self-pay

## 2019-02-14 DIAGNOSIS — Z8371 Family history of colonic polyps: Secondary | ICD-10-CM

## 2019-02-14 DIAGNOSIS — Z20828 Contact with and (suspected) exposure to other viral communicable diseases: Secondary | ICD-10-CM | POA: Insufficient documentation

## 2019-02-14 DIAGNOSIS — Z01812 Encounter for preprocedural laboratory examination: Secondary | ICD-10-CM | POA: Diagnosis present

## 2019-02-14 LAB — SARS CORONAVIRUS 2 (TAT 6-24 HRS): SARS Coronavirus 2: NEGATIVE

## 2019-02-14 NOTE — Progress Notes (Unsigned)
refer

## 2019-02-14 NOTE — Telephone Encounter (Signed)
Copied from [place] [id number]. Topic: General - Other >> Feb 14, 2019  2:25 PM Keene Breath wrote: Reason for CRM: Patient called to ask that the doctor put in her records that there is a history of polyps in the patient's family for insurance purposes.  If there are any questions, please call at [phone]

## 2019-02-17 ENCOUNTER — Telehealth: Payer: Self-pay

## 2019-02-17 NOTE — Telephone Encounter (Signed)
Returned patients call regarding colonoscopy time and prepping.  Patient states she does have her instructions, her time has been given.  She understands she is to drink bowel prep starting at 5pm and repeat 5 hours prior to colonoscopy.  She wanted to know if she could start drinking her bowel prep earlier, and she has been advised that yes she can start her bowel prep at 4 pm and then take the second bottle prior to going to bed, continuing to drink at least 8 oz of fluids up until 4 hours prior to procedure.  Thanks [employer]

## 2019-02-18 ENCOUNTER — Encounter
Admission: RE | Disposition: A | Discharge status: Home / Self Care | Payer: Self-pay | Source: Home / Self Care | Attending: Gastroenterology

## 2019-02-18 ENCOUNTER — Ambulatory Visit
Admission: RE | Admit: 2019-02-18 | Discharge: 2019-02-18 | Disposition: A | Discharge status: Home / Self Care | Payer: BC Managed Care – PPO | Attending: Gastroenterology | Admitting: Gastroenterology

## 2019-02-18 ENCOUNTER — Ambulatory Visit: Payer: BC Managed Care – PPO | Admitting: Anesthesiology

## 2019-02-18 ENCOUNTER — Other Ambulatory Visit: Payer: Self-pay

## 2019-02-18 DIAGNOSIS — D122 Benign neoplasm of ascending colon: Secondary | ICD-10-CM | POA: Diagnosis not present

## 2019-02-18 DIAGNOSIS — K635 Polyp of colon: Secondary | ICD-10-CM | POA: Diagnosis not present

## 2019-02-18 DIAGNOSIS — Z8371 Family history of colonic polyps: Secondary | ICD-10-CM | POA: Diagnosis not present

## 2019-02-18 DIAGNOSIS — D125 Benign neoplasm of sigmoid colon: Secondary | ICD-10-CM | POA: Diagnosis not present

## 2019-02-18 DIAGNOSIS — Z1211 Encounter for screening for malignant neoplasm of colon: Secondary | ICD-10-CM | POA: Insufficient documentation

## 2019-02-18 HISTORY — PX: COLONOSCOPY WITH PROPOFOL: SHX5780

## 2019-02-18 SURGERY — COLONOSCOPY WITH PROPOFOL
Anesthesia: General

## 2019-02-18 MED ORDER — PROPOFOL 500 MG/50ML IV EMUL
INTRAVENOUS | Status: DC | PRN
  Administered 2019-02-18: 150 ug/kg/min via INTRAVENOUS

## 2019-02-18 MED ORDER — PROPOFOL 10 MG/ML IV BOLUS
INTRAVENOUS | Status: DC | PRN
  Administered 2019-02-18: 30 mg via INTRAVENOUS
  Administered 2019-02-18: 70 mg via INTRAVENOUS

## 2019-02-18 MED ORDER — SODIUM CHLORIDE 0.9 % IV SOLN
INTRAVENOUS | Status: DC
  Administered 2019-02-18: 1000 mL via INTRAVENOUS

## 2019-02-18 NOTE — Anesthesia Procedure Notes (Signed)
Date/Time: 02/18/2019 11:32 AM Performed by: Nelda Marseille, CRNA Pre-anesthesia Checklist: Patient identified, Emergency Drugs available, Suction available, Patient being monitored and Timeout performed Oxygen Delivery Method: Nasal cannula

## 2019-02-18 NOTE — Transfer of Care (Signed)
Immediate Anesthesia Transfer of Care Note  Patient: Brittney Higgins  Procedure(s) Performed: COLONOSCOPY WITH PROPOFOL (N/A )  Patient Location: PACU  Anesthesia Type:General  Level of Consciousness: sedated  Airway & Oxygen Therapy: Patient Spontanous Breathing and Patient connected to nasal cannula oxygen  Post-op Assessment: Report given to RN and Post -op Vital signs reviewed and stable  Post vital signs: Reviewed and stable  Last Vitals:  Vitals Value Taken Time  BP    Temp    Pulse    Resp    SpO2      Last Pain:  Vitals:   02/18/19 1035  TempSrc: Tympanic  PainSc: 0-No pain         Complications: No apparent anesthesia complications

## 2019-02-18 NOTE — Anesthesia Postprocedure Evaluation (Signed)
Anesthesia Post Note  Patient: Brittney Higgins  Procedure(s) Performed: COLONOSCOPY WITH PROPOFOL (N/A )  Patient location during evaluation: Endoscopy Anesthesia Type: General Level of consciousness: awake and alert and oriented Pain management: pain level controlled Vital Signs Assessment: post-procedure vital signs reviewed and stable Respiratory status: spontaneous breathing, nonlabored ventilation and respiratory function stable Cardiovascular status: blood pressure returned to baseline and stable Postop Assessment: no signs of nausea or vomiting Anesthetic complications: no     Last Vitals:  Vitals:   02/18/19 1204 02/18/19 1214  BP: 122/62 119/64  Pulse: (!) 57 61  Resp: (!) 21 12  Temp:    SpO2: 100% 100%    Last Pain:  Vitals:   02/18/19 1214  TempSrc:   PainSc: 0-No pain                 [name]

## 2019-02-18 NOTE — H&P (Signed)
Brittney Lame, MD [facility] [address], [place],  [address] Phone:[phone] Fax : [phone]  Primary Care Physician:  Steele Sizer, MD Primary Gastroenterologist:  Dr. Allen Norris  Pre-Procedure History & Physical: HPI:  Brittney Higgins is a 48 y.o. female is here for an colonoscopy.   Past Medical History:  Diagnosis Date  . Anemia 2012   3years of iron meds  . Hyperlipidemia   . Medical history non-contributory   . Obesity   . Vaginal delivery 2001    Past Surgical History:  Procedure Laterality Date  . BREAST CYST ASPIRATION  age 70  . DILITATION & CURRETTAGE/HYSTROSCOPY WITH NOVASURE ABLATION N/A 06/04/2013   Procedure: DILATATION & CURETTAGE/HYSTEROSCOPY WITH NOVASURE ABLATION WITH ATTEMPTED VERSAPOINT;  Surgeon: Lovenia Kim, MD;  Location: [facility];  Service: Gynecology;  Laterality: N/A;    Prior to Admission medications   Medication Sig Start Date End Date Taking? Authorizing Provider  clobetasol (OLUX) 0.05 % topical foam Apply topically 2 (two) times daily. 01/29/18  Yes Sowles, Drue Stager, MD  Selenium Sulfide 2.3 % SHAM Apply 5 mLs topically 3 (three) times a week. 01/30/18  Yes Sowles, Drue Stager, MD  Vitamin D, Ergocalciferol, (DRISDOL) 1.25 MG (50000 UT) CAPS capsule TAKE 1 CAPSULE EVERY 14 DAYS 04/16/18  Yes Steele Sizer, MD    Allergies as of 02/04/2019  . (No Known Allergies)    Family History  Problem Relation Age of Onset  . Hypertension Mother   . COPD Mother   . Colon polyps Father   . Heart disease Father   . CAD Father   . AAA (abdominal aortic aneurysm) Father   . Diabetes Father   . Dementia Father   . Other Father        Kidney Failure  . Hypertension Sister   . Diabetes Sister   . Sleep apnea Sister   . Asthma Son   . Diabetes Sister   . Diabetes Sister   . Breast cancer Neg Hx     Social History   Socioeconomic History  . Marital status: Married    Spouse name: Shanon Brow  . Number of children: 1  . Years of education:  Not on file  . Highest education level: Master's degree (e.g., MA, MS, MEng, MEd, MSW, MBA)  Occupational History  . Occupation: assistance principal     Employer: [place]  Social Needs  . Financial resource strain: Not hard at all  . Food insecurity    Worry: Never true    Inability: Never true  . Transportation needs    Medical: No    Non-medical: No  Tobacco Use  . Smoking status: Never Smoker  . Smokeless tobacco: Never Used  Substance and Sexual Activity  . Alcohol use: No    Alcohol/week: 0.0 standard drinks  . Drug use: No  . Sexual activity: Yes    Partners: Male    Birth control/protection: None  Lifestyle  . Physical activity    Days per week: 0 days    Minutes per session: 0 min  . Stress: Rather much  Relationships  . Social connections    Talks on phone: More than three times a week    Gets together: Once a week    Attends religious service: More than 4 times per year    Active member of club or organization: No    Attends meetings of clubs or organizations: Never    Relationship status: Married  .  Intimate partner violence    Fear of current or ex partner: No    Emotionally abused: No    Physically abused: No    Forced sexual activity: No  Other Topics Concern  . 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 Neck:  Supple; no masses or thyromegaly. Lungs:  Clear throughout to auscultation.    Heart:  Regular rate and rhythm. Abdomen:  Soft, nontender and nondistended. Normal bowel sounds, without guarding, and without rebound.   Neurologic:  Alert and  oriented x4;  grossly normal  neurologically.  Impression/Plan: Brittney Higgins is here for an colonoscopy to be performed for family history of colon polyps.  Risks, benefits, limitations, and alternatives regarding  colonoscopy have been reviewed with the patient.  Questions have been answered.  All parties agreeable.   Brittney Lame, MD  02/18/2019, 11:09 AM

## 2019-02-18 NOTE — Op Note (Addendum)
[facility] Gastroenterology Patient Name: Brittney Higgins Procedure Date: 02/18/2019 11:21 AM MRN: [id number] Account #: [id number] Date of Birth: 23-Sep-1970 Admit Type: Outpatient Age: 48 Room: [facility] ENDO ROOM 4 Gender: Female Note Status: Finalized Procedure:            Colonoscopy Indications:          Family history of advanced adenoma of the colon in a                        first-degree relative before age 35 years Providers:            Midge Minium MD, MD Referring MD:         Onnie Boer. Sowles, MD (Referring MD) Medicines:            Propofol per Anesthesia Complications:        No immediate complications. Procedure:            Pre-Anesthesia Assessment:                       - Prior to the procedure, a History and Physical was                        performed, and patient medications and allergies were                        reviewed. The patient's tolerance of previous                        anesthesia was also reviewed. The risks and benefits of                        the procedure and the sedation options and risks were                        discussed with the patient. All questions were                        answered, and informed consent was obtained. Prior                        Anticoagulants: The patient has taken no previous                        anticoagulant or antiplatelet agents. ASA Grade                        Assessment: II - A patient with mild systemic disease.                        After reviewing the risks and benefits, the patient was                        deemed in satisfactory condition to undergo the                        procedure.                       After obtaining informed consent, the colonoscope was  passed under direct vision. Throughout the procedure,                        the patient's blood pressure, pulse, and oxygen                        saturations were monitored continuously. The                         Colonoscope was introduced through the anus and                        advanced to the the cecum, identified by appendiceal                        orifice and ileocecal valve. The colonoscopy was                        performed without difficulty. The patient tolerated the                        procedure well. The quality of the bowel preparation                        was excellent. Findings:      The perianal and digital rectal examinations were normal.      Two sessile polyps were found in the ascending colon. The polyps were 3       to 4 mm in size. These polyps were removed with a cold biopsy forceps.       Resection and retrieval were complete.      A 3 mm polyp was found in the sigmoid colon. The polyp was sessile. The       polyp was removed with a cold biopsy forceps. Resection and retrieval       were complete. Impression:           - Two 3 to 4 mm polyps in the ascending colon, removed                        with a cold snare. Resected and retrieved.                       - One 3 mm polyp in the sigmoid colon, removed with a                        cold biopsy forceps. Resected and retrieved. Recommendation:       - Discharge patient to home.                       - Resume previous diet.                       - Continue present medications.                       - Await pathology results.                       - Repeat colonoscopy in 5 years for surveillance. Procedure Code(s):    --- Professional ---  88416, Colonoscopy, flexible; with biopsy, single or                        multiple Diagnosis Code(s):    --- Professional ---                       Z83.71, Family history of colonic polyps                       K63.5, Polyp of colon CPT copyright 2019 American Medical Association. All rights reserved. The codes documented in this report are preliminary and upon coder review may  be revised to meet current compliance requirements. Midge Minium  MD, MD 02/18/2019 11:44:22 AM This report has been signed electronically. Number of Addenda: 0 Note Initiated On: 02/18/2019 11:21 AM Scope Withdrawal Time: 0 hours 9 minutes 11 seconds  Total Procedure Duration: 0 hours 14 minutes 9 seconds  Estimated Blood Loss: Estimated blood loss: none.      [facility]

## 2019-02-18 NOTE — Anesthesia Preprocedure Evaluation (Signed)
Anesthesia Evaluation  Patient identified by MRN, date of birth, ID band Patient awake    Reviewed: Allergy & Precautions, NPO status , Patient's Chart, lab work & pertinent test results  History of Anesthesia Complications Negative for: history of anesthetic complications  Airway Mallampati: II  TM Distance: >3 FB Neck ROM: Full    Dental no notable dental hx.    Pulmonary neg pulmonary ROS, neg sleep apnea, neg COPD,    breath sounds clear to auscultation- rhonchi (-) wheezing      Cardiovascular Exercise Tolerance: Good (-) hypertension(-) CAD, (-) Past MI, (-) Cardiac Stents and (-) CABG  Rhythm:Regular Rate:Normal - Systolic murmurs and - Diastolic murmurs    Neuro/Psych neg Seizures PSYCHIATRIC DISORDERS Depression negative neurological ROS     GI/Hepatic negative GI ROS, Neg liver ROS,   Endo/Other  negative endocrine ROSneg diabetes  Renal/GU negative Renal ROS     Musculoskeletal negative musculoskeletal ROS (+)   Abdominal (+) + obese,   Peds  Hematology  (+) anemia ,   Anesthesia Other Findings Past Medical History: 2012: Anemia     Comment:  3years of iron meds No date: Hyperlipidemia No date: Medical history non-contributory No date: Obesity 2001: Vaginal delivery   Reproductive/Obstetrics                             Anesthesia Physical Anesthesia Plan  ASA: II  Anesthesia Plan: General   Post-op Pain Management:    Induction: Intravenous  PONV Risk Score and Plan: 2 and Propofol infusion  Airway Management Planned: Natural Airway  Additional Equipment:   Intra-op Plan:   Post-operative Plan:   Informed Consent: I have reviewed the patients History and Physical, chart, labs and discussed the procedure including the risks, benefits and alternatives for the proposed anesthesia with the patient or authorized representative who has indicated his/her  understanding and acceptance.     Dental advisory given  Plan Discussed with: CRNA and Anesthesiologist  Anesthesia Plan Comments:         Anesthesia Quick Evaluation

## 2019-02-18 NOTE — Anesthesia Post-op Follow-up Note (Signed)
Anesthesia QCDR form completed.        

## 2019-02-19 ENCOUNTER — Encounter: Payer: Self-pay | Admitting: Gastroenterology

## 2019-02-19 LAB — SURGICAL PATHOLOGY

## 2019-03-12 ENCOUNTER — Encounter: Payer: Self-pay | Admitting: *Deleted

## 2019-03-26 ENCOUNTER — Ambulatory Visit
Admission: RE | Admit: 2019-03-26 | Discharge: 2019-03-26 | Disposition: A | Discharge status: Home / Self Care | Payer: BC Managed Care – PPO | Source: Ambulatory Visit | Attending: Family Medicine | Admitting: Family Medicine

## 2019-03-26 DIAGNOSIS — Z1231 Encounter for screening mammogram for malignant neoplasm of breast: Secondary | ICD-10-CM | POA: Diagnosis present

## 2019-03-26 DIAGNOSIS — Z0000 Encounter for general adult medical examination without abnormal findings: Secondary | ICD-10-CM

## 2019-03-26 DIAGNOSIS — Z1239 Encounter for other screening for malignant neoplasm of breast: Secondary | ICD-10-CM

## 2019-04-08 ENCOUNTER — Other Ambulatory Visit: Payer: Self-pay

## 2019-04-08 DIAGNOSIS — Z20822 Contact with and (suspected) exposure to covid-19: Secondary | ICD-10-CM

## 2019-04-10 LAB — NOVEL CORONAVIRUS, NAA: SARS-CoV-2, NAA: NOT DETECTED

## 2019-05-07 ENCOUNTER — Other Ambulatory Visit: Payer: Self-pay | Admitting: Family Medicine

## 2019-05-07 NOTE — Telephone Encounter (Signed)
Copied from [place] [id number]. Topic: Quick Communication - Rx Refill/Question >> May 07, 2019 12:01 PM Izola Price, [place] A wrote: Medication: clobetasol (OLUX) 0.05 % topical foam   Has the patient contacted their pharmacy?yes (Agent: If no, request that the patient contact the pharmacy for the refill.) (Agent: If yes, when and what did the pharmacy advise?)Contact PCP  Preferred Pharmacy (with phone number or street name): [id number] [address], [place] [phone] (Phone) [phone] (Fax)    Agent: Please be advised that RX refills may take up to 3 business days. We ask that you follow-up with your pharmacy.

## 2019-05-08 MED ORDER — CLOBETASOL PROPIONATE 0.05 % EX FOAM: Freq: Two times a day (BID) | CUTANEOUS | 0 refills | Status: DC

## 2019-07-04 ENCOUNTER — Other Ambulatory Visit: Payer: BC Managed Care – PPO

## 2019-07-04 ENCOUNTER — Ambulatory Visit: Payer: BC Managed Care – PPO | Attending: Internal Medicine

## 2019-07-04 DIAGNOSIS — Z20822 Contact with and (suspected) exposure to covid-19: Secondary | ICD-10-CM

## 2019-07-05 LAB — NOVEL CORONAVIRUS, NAA: SARS-CoV-2, NAA: NOT DETECTED

## 2019-08-19 ENCOUNTER — Other Ambulatory Visit: Payer: Self-pay | Admitting: Family Medicine

## 2019-08-19 DIAGNOSIS — Z01419 Encounter for gynecological examination (general) (routine) without abnormal findings: Secondary | ICD-10-CM

## 2019-08-19 DIAGNOSIS — E559 Vitamin D deficiency, unspecified: Secondary | ICD-10-CM

## 2019-08-19 MED ORDER — VITAMIN D (ERGOCALCIFEROL) 1.25 MG (50000 UNIT) PO CAPS: ORAL_CAPSULE | ORAL | 5 refills | Status: DC

## 2019-08-19 NOTE — Telephone Encounter (Signed)
Medication: Vitamin D, Ergocalciferol, (DRISDOL) 1.25 MG (50000 UT) CAPS capsule [id number]      Pharmacy:  [id number] Odis [place] [address] Phone:  [phone]  Fax:  [phone]       Pt aware of turn around time

## 2020-02-04 NOTE — Progress Notes (Signed)
Name: Brittney Higgins   MRN: [id number]    DOB: 03-Jul-1970   Date:02/05/2020       Progress Note  Subjective  Chief Complaint  Chief Complaint  Patient presents with  . Annual Exam    HPI  Patient presents for annual CPE.  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 She is sleeping well now, no longer having palpitation   Dyslipidemia: reviewed labs with patient, we will recheck it today  The 10-year ASCVD risk score Denman George DC Montez Hageman., et al., 2013) is: 4%   Values used to calculate the score:     Age: 2 years     Sex: Female     Is Non-Hispanic African American: Yes     Diabetic: No     Tobacco smoker: No     Systolic Blood Pressure: 140 mmHg     Is BP treated: No     HDL Cholesterol: 41 mg/dL     Total Cholesterol: 211 mg/dL  Metabolic syndrome: last A1C was over 6% and trending up, discussed life style modifications. She states she still eats more when stressed, she will try to stop having the morning refresher from [employer].   History of anemia: we will recheck labs, no pica  Seborrhea capitis: she had a severe flare when her son was 3 years, she still has some of the topical medications, but losing hair again, scalp is tender and flaky at times.   Diet: discussed  Exercise:  Not currently , stopped going to the gym one year ago and has gained 12 lbs.     Office Visit from 02/04/2019 in [facility]  AUDIT-C Score 0     Depression: Phq 9 is  positive Depression screen [facility] 2/9 02/05/2020 02/04/2019 05/23/2018 01/29/2018 01/26/2017  Decreased Interest 0 0 0 0 0  Down, Depressed, Hopeless 1 0 0 0 0  PHQ - 2 Score 1 0 0 0 0  Altered sleeping 0 0 0 0 -  Tired, decreased energy 1 0 0 3 -  Change in appetite 1 0 0 1 -  Feeling bad or failure about yourself  0 0 0 0 -  Trouble concentrating 0 0 0 0 -  Moving slowly  or fidgety/restless 0 0 0 0 -  Suicidal thoughts 0 0 0 0 -  PHQ-9 Score 3 0 0 4 -  Difficult doing work/chores Not difficult at all - - Not difficult at all -   Hypertension: BP Readings from Last 3 Encounters:  02/05/20 140/90  02/18/19 119/64  02/04/19 (!) 120/58   Obesity: Wt Readings from Last 3 Encounters:  02/05/20 192 lb 3.2 oz (87.2 kg)  02/18/19 190 lb (86.2 kg)  02/04/19 190 lb 9.6 oz (86.5 kg)   BMI Readings from Last 3 Encounters:  02/05/20 34.05 kg/m  02/18/19 33.66 kg/m  02/04/19 33.76 kg/m     Vaccines:  HPV: over age 49 Shingrix:N/A only 49 yrs old. Pneumonia:  educated and discussed with patient. Flu: Declined  Hep C Screening: 02/04/2019 STD testing and prevention (HIV/chl/gon/syphilis): N/A Intimate partner violence:Negative Screen Sexual History : no pain Menstrual History/LMP/Abnormal Bleeding: No cycles for years, since an ablation Incontinence Symptoms: no symptoms  Breast cancer:  - Last Mammogram: Due - BRCA gene screening: N/A  Osteoporosis: Discussed high calcium and vitamin D supplementation, weight  bearing exercises  Cervical cancer screening: Due 01/29/2021  Skin cancer: Discussed monitoring for atypical lesions  Colorectal cancer: Due 02/18/2024 Lung cancer: Low Dose CT Chest recommended if Age 49-80 years, 30 pack-year currently smoking OR have quit w/in 49years. Patient does not qualify.   ECG:   Advanced Care Planning: A voluntary discussion about advance care planning including the explanation and discussion of advance directives.  Discussed health care proxy and Living will, and the patient was able to identify a health care proxy as husband.  Patient does not have a living will at present time. If patient does have living will, I have requested they bring this to the clinic to be scanned in to their chart.   Lipids: Lab Results  Component Value Date   CHOL 211 (H) 02/04/2019   CHOL 197 01/29/2018   CHOL 165 01/26/2017    Lab Results  Component Value Date   HDL 41 (L) 02/04/2019   HDL 42 (L) 01/29/2018   HDL 37 (L) 01/26/2017   Lab Results  Component Value Date   LDLCALC 135 (H) 02/04/2019   LDLCALC 126 (H) 01/29/2018   LDLCALC 108 (H) 01/26/2017   Lab Results  Component Value Date   TRIG 210 (H) 02/04/2019   TRIG 176 (H) 01/29/2018   TRIG 101 01/26/2017   Lab Results  Component Value Date   CHOLHDL 5.1 (H) 02/04/2019   CHOLHDL 4.7 01/29/2018   CHOLHDL 4.5 01/26/2017   No results found for: LDLDIRECT  Glucose: Glucose, Bld  Date Value Ref Range Status  02/04/2019 114 (H) 65 - 99 mg/dL Final    Comment:    .            Fasting reference interval . For someone without known diabetes, a glucose value between 100 and 125 mg/dL is consistent with prediabetes and should be confirmed with a follow-up test. .   01/29/2018 94 65 - 99 mg/dL Final    Comment:    .            Fasting reference interval .   01/26/2017 100 (H) 65 - 99 mg/dL Final    Patient Active Problem List   Diagnosis Date Noted  . Polyp of sigmoid colon   . Family history of colonic polyps 02/14/2019  . Depression 01/26/2016  . Seborrheic dermatitis of scalp 01/08/2015  . Metabolic syndrome 01/08/2015  . Lipoma of arm 01/08/2015  . History of anemia 01/01/2015  . Dyslipidemia 01/01/2015  . Irregular bleeding 01/01/2015  . Obesity (BMI 30.0-34.9) 01/01/2015  . Vitamin D deficiency 01/01/2015  . Chronic constipation 01/01/2015  . Fibroid 01/01/2015  . Bunion 01/01/2015  . Abnormal CBC 01/01/2015    Past Surgical History:  Procedure Laterality Date  . BREAST CYST ASPIRATION  age 49  . COLONOSCOPY WITH PROPOFOL N/A 02/18/2019   Procedure: COLONOSCOPY WITH PROPOFOL;  Surgeon: Midge Minium, MD;  Location: [facility] ENDOSCOPY;  Service: Endoscopy;  Laterality: N/A;  . DILITATION & CURRETTAGE/HYSTROSCOPY WITH NOVASURE ABLATION N/A 06/04/2013   Procedure: DILATATION & CURETTAGE/HYSTEROSCOPY WITH NOVASURE ABLATION  WITH ATTEMPTED VERSAPOINT;  Surgeon: Lenoard Aden, MD;  Location: [facility]S;  Service: Gynecology;  Laterality: N/A;    Family History  Problem Relation Age of Onset  . Hypertension Mother   . COPD Mother   . Colon polyps Father   . Heart disease Father   . CAD Father   . AAA (abdominal aortic aneurysm) Father   . Diabetes Father   . Dementia Father   .  Other Father        Kidney Failure  . Hypertension Sister   . Diabetes Sister   . Sleep apnea Sister   . Asthma Son   . Diabetes Sister   . Diabetes Sister   . Breast cancer Neg Hx     Social History   Socioeconomic History  . Marital status: Married    Spouse name: Onalee Hua  . Number of children: 1  . Years of education: Not on file  . Highest education level: Master's degree (e.g., MA, MS, MEng, MEd, MSW, MBA)  Occupational History  . Occupation: assistance principal     Employer: [employer]    Comment: [employer]  Tobacco Use  . Smoking status: Never Smoker  . Smokeless tobacco: Never Used  Vaping Use  . Vaping Use: Never used  Substance and Sexual Activity  . Alcohol use: No    Alcohol/week: 0.0 standard drinks  . Drug use: No  . Sexual activity: Yes    Partners: Male    Birth control/protection: None  Other Topics Concern  . 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 Difficulty of Paying Living Expenses: Not hard at all  Food Insecurity: No Food Insecurity  . Worried About Programme researcher, broadcasting/film/video in the Last Year: Never true  . Ran Out of Food in the Last Year: Never true  Transportation Needs: No Transportation Needs  . Lack of Transportation (Medical): No  . Lack of Transportation (Non-Medical): No  Physical Activity: Inactive  . Days of Exercise per Week: 0 days  . Minutes of Exercise per Session: 0 min  Stress: No Stress Concern Present  . Feeling of Stress : Not at all  Social  Connections: Moderately Integrated  . Frequency of Communication with Friends and Family: More than three times a week  . Frequency of Social Gatherings with Friends and Family: Once a week  . Attends Religious Services: More than 4 times per year  . Active Member of Clubs or Organizations: No  . Attends Banker Meetings: Never  . Marital Status: Married  Catering manager Violence: Not At Risk  . Fear of Current or Ex-Partner: No  . Emotionally Abused: No  . Physically Abused: No  . Sexually Abused: No     Current Outpatient Medications:  .  clobetasol (OLUX) 0.05 % topical foam, Apply topically 2 (two) times daily., Disp: 50 g, Rfl: 0 .  Vitamin D, Ergocalciferol, (DRISDOL) 1.25 MG (50000 UNIT) CAPS capsule, Weekly, Disp: 12 capsule, Rfl: 5 .  doxycycline (VIBRA-TABS) 100 MG tablet, Take 1 tablet (100 mg total) by mouth 2 (two) times daily., Disp: 20 tablet, Rfl: 0 .  fluocinolone (SYNALAR) 0.025 % ointment, Apply topically 2 (two) times daily., Disp: 60 g, Rfl: 1 .  [START ON 02/06/2020] Selenium Sulfide 2.3 % SHAM, Apply 5 mLs topically 3 (three) times a week., Disp: 180 mL, Rfl: 2  No Known Allergies   ROS  Constitutional: Negative for fever or weight change.  Respiratory: Negative for cough and shortness of breath.   Cardiovascular: Negative for chest pain or palpitations.  Gastrointestinal: Negative for abdominal pain, no bowel changes.  Musculoskeletal: Negative for gait problem or joint swelling.  Skin: Positive  for rash, losing hair .  Neurological: Negative for dizziness or headache.  No other specific complaints in a complete  review of systems (except as listed in HPI above).  Objective   Vitals:   02/05/20 0905  BP: 140/90  Pulse: 86  Resp: 16  Temp: 98.7 F (37.1 C)  TempSrc: Oral  SpO2: 100%  Weight: 192 lb 3.2 oz (87.2 kg)  Height: 5\' 3"  (1.6 m)    Body mass index is 34.05 kg/m.  Physical Exam  Constitutional: Patient appears  well-developed and well-nourished. No distress.  HENT: Head: Normocephalic and atraumatic. Ears: B TMs ok, no erythema or effusion; Nose: Nose normal. Mouth/Throat: not done.  Eyes: Conjunctivae and EOM are normal. Pupils are equal, round, and reactive to light. No scleral icterus.  Neck: Normal range of motion. Neck supple. No JVD present. No thyromegaly present.  Cardiovascular: Normal rate, regular rhythm and normal heart sounds.  No murmur heard. No BLE edema. Pulmonary/Chest: Effort normal and breath sounds normal. No respiratory distress. Abdominal: Soft. Bowel sounds are normal, no distension. There is no tenderness. no masses Breast: no lumps or masses, no nipple discharge or rashes FEMALE GENITALIA:  Not done RECTAL: not done Musculoskeletal: Normal range of motion, no joint effusions. No gross deformities Neurological: he is alert and oriented to person, place, and time. No cranial nerve deficit. Coordination, balance, strength, speech and gait are normal.  Skin: Skin is warm and dry. No rash noted. No erythema.  Psychiatric: Patient has a normal mood and affect. behavior is normal. Judgment and thought content normal.  Fall Risk: Fall Risk  02/05/2020 02/04/2019 05/23/2018 01/29/2018 01/26/2017  Falls in the past year? 0 0 1 No Yes  Number falls in past yr: 0 0 0 - 1  Injury with Fall? 0 0 1 - No    Functional Status Survey: Is the patient deaf or have difficulty hearing?: No Does the patient have difficulty seeing, even when wearing glasses/contacts?: No Does the patient have difficulty concentrating, remembering, or making decisions?: No Does the patient have difficulty walking or climbing stairs?: No Does the patient have difficulty dressing or bathing?: No Does the patient have difficulty doing errands alone such as visiting a doctor's office or shopping?: No   Assessment & Plan  1. Well adult exam  - COMPLETE METABOLIC PANEL WITH GFR  2. Dyslipidemia  - Lipid  panel  3. Need for Tdap vaccination  - Tdap vaccine greater than or equal to 7yo IM  4. Metabolic syndrome  - COMPLETE METABOLIC PANEL WITH GFR - Hemoglobin A1c  5. Vitamin D deficiency  - VITAMIN D 25 Hydroxy (Vit-D Deficiency, Fractures)  6. Situational depression  Very stressed at work   7. Encounter for screening mammogram for malignant neoplasm of breast  mammogram  8. Need for immunization against influenza  - Flu Vaccine QUAD 6+ mos PF IM (Fluarix Quad PF)  9. History of iron deficiency  - CBC with Differential/Platelet - Iron, TIBC and Ferritin Panel  10. Hair loss  - fluocinolone (SYNALAR) 0.025 % ointment; Apply topically 2 (two) times daily.  Dispense: 60 g; Refill: 1  11. Seborrheic dermatitis of scalp  - Selenium Sulfide 2.3 % SHAM; Apply 5 mLs topically 3 (three) times a week.  Dispense: 180 mL; Refill: 2 - fluocinolone (SYNALAR) 0.025 % ointment; Apply topically 2 (two) times daily.  Dispense: 60 g; Refill: 1 -USPSTF grade A and B recommendations reviewed with patient; age-appropriate recommendations, preventive care, screening tests, etc discussed and encouraged; healthy living encouraged; see AVS for patient education given to patient -Discussed importance of 150 minutes of physical activity  weekly, eat two servings of fish weekly, eat one serving of tree nuts ( cashews, pistachios, pecans, almonds.Marland Kitchen) every other day, eat 6 servings of fruit/vegetables daily and drink plenty of water and avoid sweet beverages.

## 2020-02-05 ENCOUNTER — Encounter: Payer: Self-pay | Admitting: Family Medicine

## 2020-02-05 ENCOUNTER — Other Ambulatory Visit: Payer: Self-pay

## 2020-02-05 ENCOUNTER — Ambulatory Visit (INDEPENDENT_AMBULATORY_CARE_PROVIDER_SITE_OTHER): Payer: BC Managed Care – PPO | Admitting: Family Medicine

## 2020-02-05 VITALS — BP 140/90 | HR 86 | Temp 98.7°F | Resp 16 | Ht 63.0 in | Wt 192.2 lb

## 2020-02-05 DIAGNOSIS — E8881 Metabolic syndrome: Secondary | ICD-10-CM

## 2020-02-05 DIAGNOSIS — Z0000 Encounter for general adult medical examination without abnormal findings: Secondary | ICD-10-CM

## 2020-02-05 DIAGNOSIS — E785 Hyperlipidemia, unspecified: Secondary | ICD-10-CM

## 2020-02-05 DIAGNOSIS — Z1231 Encounter for screening mammogram for malignant neoplasm of breast: Secondary | ICD-10-CM

## 2020-02-05 DIAGNOSIS — L219 Seborrheic dermatitis, unspecified: Secondary | ICD-10-CM

## 2020-02-05 DIAGNOSIS — Z23 Encounter for immunization: Secondary | ICD-10-CM

## 2020-02-05 DIAGNOSIS — Z8639 Personal history of other endocrine, nutritional and metabolic disease: Secondary | ICD-10-CM

## 2020-02-05 DIAGNOSIS — E559 Vitamin D deficiency, unspecified: Secondary | ICD-10-CM

## 2020-02-05 DIAGNOSIS — L659 Nonscarring hair loss, unspecified: Secondary | ICD-10-CM

## 2020-02-05 DIAGNOSIS — F4321 Adjustment disorder with depressed mood: Secondary | ICD-10-CM

## 2020-02-05 MED ORDER — SELENIUM SULFIDE 2.3 % EX SHAM: 5.0000 mL | MEDICATED_SHAMPOO | CUTANEOUS | 2 refills | Status: DC

## 2020-02-05 MED ORDER — DOXYCYCLINE HYCLATE 100 MG PO TABS: 100.0000 mg | ORAL_TABLET | Freq: Two times a day (BID) | ORAL | 0 refills | Status: DC

## 2020-02-05 MED ORDER — FLUOCINOLONE ACETONIDE 0.025 % EX OINT: TOPICAL_OINTMENT | Freq: Two times a day (BID) | CUTANEOUS | 1 refills | Status: DC

## 2020-02-05 NOTE — Patient Instructions (Signed)

## 2020-02-06 LAB — CBC WITH DIFFERENTIAL/PLATELET
Absolute Monocytes: 491 cells/uL (ref 200–950)
Basophils Absolute: 50 cells/uL (ref 0–200)
Basophils Relative: 0.8 %
Eosinophils Absolute: 239 cells/uL (ref 15–500)
Eosinophils Relative: 3.8 %
HCT: 42.7 % (ref 35.0–45.0)
Hemoglobin: 13.3 g/dL (ref 11.7–15.5)
Lymphs Abs: 2678 cells/uL (ref 850–3900)
MCH: 22.1 pg — ABNORMAL LOW (ref 27.0–33.0)
MCHC: 31.1 g/dL — ABNORMAL LOW (ref 32.0–36.0)
MCV: 71 fL — ABNORMAL LOW (ref 80.0–100.0)
MPV: 11.1 fL (ref 7.5–12.5)
Monocytes Relative: 7.8 %
Neutro Abs: 2841 cells/uL (ref 1500–7800)
Neutrophils Relative %: 45.1 %
Platelets: 299 10*3/uL (ref 140–400)
RBC: 6.01 10*6/uL — ABNORMAL HIGH (ref 3.80–5.10)
RDW: 14.6 % (ref 11.0–15.0)
Total Lymphocyte: 42.5 %
WBC: 6.3 10*3/uL (ref 3.8–10.8)

## 2020-02-06 LAB — LIPID PANEL
Cholesterol: 200 mg/dL — ABNORMAL HIGH (ref <200)
HDL: 43 mg/dL — ABNORMAL LOW (ref >=50)
LDL Cholesterol (Calc): 131 mg/dL (calc) — ABNORMAL HIGH
Non-HDL Cholesterol (Calc): 157 mg/dL (calc) — ABNORMAL HIGH (ref <130)
Total CHOL/HDL Ratio: 4.7 (calc) (ref <5.0)
Triglycerides: 147 mg/dL (ref <150)

## 2020-02-06 LAB — COMPLETE METABOLIC PANEL WITH GFR
AG Ratio: 1.5 (calc) (ref 1.0–2.5)
ALT: 21 U/L (ref 6–29)
AST: 19 U/L (ref 10–35)
Albumin: 4.5 g/dL (ref 3.6–5.1)
Alkaline phosphatase (APISO): 51 U/L (ref 31–125)
BUN: 12 mg/dL (ref 7–25)
CO2: 29 mmol/L (ref 20–32)
Calcium: 9.3 mg/dL (ref 8.6–10.2)
Chloride: 104 mmol/L (ref 98–110)
Creat: 0.9 mg/dL (ref 0.50–1.10)
GFR, Est African American: 87 mL/min/{1.73_m2} (ref >=60)
GFR, Est Non African American: 75 mL/min/{1.73_m2} (ref >=60)
Globulin: 3.1 g/dL (calc) (ref 1.9–3.7)
Glucose, Bld: 120 mg/dL — ABNORMAL HIGH (ref 65–99)
Potassium: 4.4 mmol/L (ref 3.5–5.3)
Sodium: 142 mmol/L (ref 135–146)
Total Bilirubin: 0.5 mg/dL (ref 0.2–1.2)
Total Protein: 7.6 g/dL (ref 6.1–8.1)

## 2020-02-06 LAB — IRON,TIBC AND FERRITIN PANEL
%SAT: 29 % (calc) (ref 16–45)
Ferritin: 80 ng/mL (ref 16–232)
Iron: 105 ug/dL (ref 40–190)
TIBC: 368 mcg/dL (calc) (ref 250–450)

## 2020-02-06 LAB — HEMOGLOBIN A1C
Hgb A1c MFr Bld: 6.4 % of total Hgb — ABNORMAL HIGH (ref <5.7)
Mean Plasma Glucose: 137 (calc)
eAG (mmol/L): 7.6 (calc)

## 2020-02-06 LAB — VITAMIN D 25 HYDROXY (VIT D DEFICIENCY, FRACTURES): Vit D, 25-Hydroxy: 35 ng/mL (ref 30–100)

## 2020-02-08 ENCOUNTER — Other Ambulatory Visit: Payer: Self-pay | Admitting: Family Medicine

## 2020-02-08 DIAGNOSIS — L219 Seborrheic dermatitis, unspecified: Secondary | ICD-10-CM

## 2020-03-24 ENCOUNTER — Other Ambulatory Visit: Payer: Self-pay

## 2020-03-24 ENCOUNTER — Ambulatory Visit: Payer: BC Managed Care – PPO | Admitting: Dermatology

## 2020-03-24 ENCOUNTER — Encounter: Payer: Self-pay | Admitting: Dermatology

## 2020-03-24 DIAGNOSIS — L669 Cicatricial alopecia, unspecified: Secondary | ICD-10-CM

## 2020-03-24 DIAGNOSIS — L649 Androgenic alopecia, unspecified: Secondary | ICD-10-CM

## 2020-03-24 DIAGNOSIS — L219 Seborrheic dermatitis, unspecified: Secondary | ICD-10-CM

## 2020-03-24 MED ORDER — FLUOCINOLONE ACETONIDE SCALP 0.01 % EX OIL: TOPICAL_OIL | CUTANEOUS | 2 refills | Status: DC

## 2020-03-24 MED ORDER — DOXYCYCLINE HYCLATE 100 MG PO CAPS: 100.0000 mg | ORAL_CAPSULE | Freq: Every day | ORAL | 2 refills | Status: AC

## 2020-03-24 MED ORDER — CLOBETASOL PROPIONATE EMULSION 0.05 % EX FOAM: CUTANEOUS | 2 refills | Status: DC

## 2020-03-24 MED ORDER — KETOCONAZOLE 2 % EX SHAM: MEDICATED_SHAMPOO | CUTANEOUS | 3 refills | Status: DC

## 2020-03-24 NOTE — Progress Notes (Addendum)
   New Patient Visit  Subjective  Brittney Higgins is a 49 y.o. female who presents for the following: Skin Problem (Crown of scalp. C/O thin spot. Dur: years. Irritation comes and goes. PCP prescribed antibiotic in September. Helped with irritation).  She was seen here years ago and had topical and oral treatment.  She gets relaxers every 2 months.  There is a family h/o hair loss in males in the family, but not females.  This has been going on for years and is gradually worsening over time with more hair loss.  She was getting tender bumps, but the oral antibiotic helped.    Objective  Well appearing patient in no apparent distress; mood and affect are within normal limits.  Review of Systems: No other skin or systemic complaints except as noted in HPI or Assessment and Plan.   A focused examination was performed including head, including the scalp, face, neck, nose, ears, eyelids, and lips. Relevant physical exam findings are noted in the Assessment and Plan.  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  Difficult to treat.  Hair will not regrow in scarred areas No cure.  Start Doxycycline 100mg  1 po QD with food Ketoconazole 2% shampoo once a week, lather in, leave on 5-10 minutes, rinse well. Start Olux E foam QAM to affected area on scalp. Start Fluocinolone oil QD as needed to scalp Recommend OTC 5% Rogaine QHS, Olux E foam QAM Avoid chemical relaxers/heat to hair/scalp Pt may need wig if unable to camouflage after she goes natural.  Doxycycline should be taken with food to prevent nausea. Do not lay down for 30 minutes after taking. Be cautious with sun exposure and use good sun  protection while on this medication. Pregnant women should not take this medication.    doxycycline (VIBRAMYCIN) 100 MG capsule - Crown and vertex Scalp  ketoconazole (NIZORAL) 2 % shampoo - Crown and vertex Scalp  Clobetasol Propionate Emulsion 0.05 % topical foam - Crown and vertex Scalp  Fluocinolone Acetonide Scalp 0.01 % OIL - Crown and vertex Scalp  Return in about 3 months (around 06/24/2020) for [facility] recheck.   I, Emelia Salisbury, CMA, am acting as scribe for Brendolyn Patty, MD.  Documentation: I have reviewed the above documentation for accuracy and completeness, and I agree with the above.  Brendolyn Patty MD

## 2020-03-24 NOTE — Patient Instructions (Addendum)
Doxycycline should be taken with food to prevent nausea. Do not lay down for 30 minutes after taking. Be cautious with sun exposure and use good sun protection while on this medication. Pregnant women should not take this medication.   Topical steroids (such as triamcinolone, fluocinolone, fluocinonide, mometasone, clobetasol, halobetasol, betamethasone, hydrocortisone) can cause thinning and lightening of the skin if they are used for too long in the same area. Your physician has selected the right strength medicine for your problem and area affected on the body. Please use your medication only as directed by your physician to prevent side effects.     Take Doxycycline once daily with food.  Use Ketoconazole shampoo once a week, lather on scalp, leave on 5 minutes, rinse well.  Use Clobetasol foam in morning to affected area on scalp.  Use Rogaine at bedtime. Use Fluocinolone oil daily as needed to scalp.

## 2020-07-05 ENCOUNTER — Ambulatory Visit: Payer: BC Managed Care – PPO | Admitting: Dermatology

## 2020-07-30 ENCOUNTER — Other Ambulatory Visit: Payer: Self-pay | Admitting: Family Medicine

## 2020-07-30 DIAGNOSIS — Z1231 Encounter for screening mammogram for malignant neoplasm of breast: Secondary | ICD-10-CM

## 2020-08-03 NOTE — Progress Notes (Signed)
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   Dyslipidemia: reviewed labs with patient  The 10-year ASCVD risk score Denman George DC Montez Hageman., et al., 2013) is: 3.4%   Values used to calculate the score:     Age: 50 years     Sex: Female     Is Non-Hispanic African American: Yes     Diabetic: No     Tobacco smoker: No     Systolic Blood Pressure: 138 mmHg     Is BP treated: No     HDL Cholesterol: 43 mg/dL     Total Cholesterol: 200 mg/dL  Metabolic syndrome: last A1C was 6.4%, she keeps gaining weight, she denies polyphagia, polydipsia or polyuria. She was eating more when stressed  Chronic constipation: doing better as long as she eats a balance and healthy diet, not on medications at this time  History of anemia: iron storage was normal, RBC is always high and MCV low, discussed sending her back to hematologist She is willing to go back.   Seborrhea capitis: she has seen Dr. Roseanne Reno, she is doing better, she still has a bald spot but the flakiness has improved, using clobetasol foam and fluocinonide oil also nizoral shampoo.   Ear fullness: going on for the past two weeks, feels clogged  Patient Active Problem List   Diagnosis Date Noted  . Polyp of sigmoid colon   . Family history of colonic polyps 02/14/2019  . Depression 01/26/2016  . Seborrheic dermatitis of scalp 01/08/2015  . Metabolic syndrome 01/08/2015  . Lipoma of arm 01/08/2015  . History of anemia 01/01/2015  . Dyslipidemia 01/01/2015  . Irregular bleeding 01/01/2015  . Obesity (BMI 30.0-34.9) 01/01/2015  . Vitamin D deficiency 01/01/2015  . Chronic constipation 01/01/2015  . Fibroid 01/01/2015  . Bunion 01/01/2015  . Abnormal CBC 01/01/2015    Past Surgical History:  Procedure Laterality Date  . BREAST  CYST ASPIRATION  age 74  . COLONOSCOPY WITH PROPOFOL N/A 02/18/2019   Procedure: COLONOSCOPY WITH PROPOFOL;  Surgeon: Midge Minium, MD;  Location: [facility] ENDOSCOPY;  Service: Endoscopy;  Laterality: N/A;  . DILITATION & CURRETTAGE/HYSTROSCOPY WITH NOVASURE ABLATION N/A 06/04/2013   Procedure: DILATATION & CURETTAGE/HYSTEROSCOPY WITH NOVASURE ABLATION WITH ATTEMPTED VERSAPOINT;  Surgeon: Lenoard Aden, MD;  Location: [facility];  Service: Gynecology;  Laterality: N/A;    Family History  Problem Relation Age of Onset  . Hypertension Mother   . COPD Mother   . Colon polyps Father   . Heart disease Father   . CAD Father   . AAA (abdominal aortic aneurysm) Father   . Diabetes Father   . Dementia Father   . Other Father        Kidney Failure  . Hypertension Sister   . Diabetes Sister   . Sleep apnea Sister   . Asthma Son   . Diabetes Sister   . Diabetes Sister   . Breast cancer Neg Hx     Social History   Tobacco Use  . Smoking status: Never Smoker  . Smokeless tobacco: Never Used  Substance Use Topics  . Alcohol use: No    Alcohol/week: 0.0 standard drinks     Current Outpatient Medications:  .  clobetasol (  OLUX) 0.05 % topical foam, Apply topically 2 (two) times daily., Disp: 50 g, Rfl: 0 .  Clobetasol Propionate Emulsion 0.05 % topical foam, Apply to affected area on scalp every morning., Disp: 100 g, Rfl: 2 .  Fluocinolone Acetonide Scalp 0.01 % OIL, QD to scalp PRN, Disp: 120 mL, Rfl: 2 .  ketoconazole (NIZORAL) 2 % shampoo, Once weekly, lather on scalp, leave on 5 minutes, rinse well., Disp: 120 mL, Rfl: 3 .  Selenium Sulfide 2.3 % SHAM, Apply 5 mLs topically 3 (three) times a week., Disp: 180 mL, Rfl: 2 .  Vitamin D, Ergocalciferol, (DRISDOL) 1.25 MG (50000 UNIT) CAPS capsule, Weekly, Disp: 12 capsule, Rfl: 5  No Known Allergies  I personally reviewed active problem list, medication list, allergies, family history, social history, health maintenance with the  patient/caregiver today.   ROS  Constitutional: Negative for fever or weight change.  Respiratory: Negative for cough and shortness of breath.   Cardiovascular: Negative for chest pain or palpitations.  Gastrointestinal: Negative for abdominal pain, no bowel changes.  Musculoskeletal: Negative for gait problem or joint swelling.  Skin: Negative for rash.  Neurological: Negative for dizziness or headache.  No other specific complaints in a complete review of systems (except as listed in HPI above).  Objective  Vitals:   08/04/20 0804  BP: 138/80  Pulse: 85  Resp: 16  Temp: 97.6 F (36.4 C)  TempSrc: Oral  SpO2: 99%  Weight: 191 lb (86.6 kg)  Height: 5\' 3"  (1.6 m)    Body mass index is 33.83 kg/m.  Physical Exam  Constitutional: Patient appears well-developed and well-nourished. Obese No distress.  HEENT: head atraumatic, normocephalic, pupils equal and reactive to light, ears : cerumen impaction both ear canals , neck supple, throat within normal limits Cardiovascular: Normal rate, regular rhythm and normal heart sounds.  No murmur heard. No BLE edema. Pulmonary/Chest: Effort normal and breath sounds normal. No respiratory distress. Abdominal: Soft.  There is no tenderness. Psychiatric: Patient has a normal mood and affect. behavior is normal. Judgment and thought content normal.  PHQ2/9: Depression screen [facility] 2/9 08/04/2020 02/05/2020 02/04/2019 05/23/2018 01/29/2018  Decreased Interest 0 0 0 0 0  Down, Depressed, Hopeless 0 1 0 0 0  PHQ - 2 Score 0 1 0 0 0  Altered sleeping - 0 0 0 0  Tired, decreased energy - 1 0 0 3  Change in appetite - 1 0 0 1  Feeling bad or failure about yourself  - 0 0 0 0  Trouble concentrating - 0 0 0 0  Moving slowly or fidgety/restless - 0 0 0 0  Suicidal thoughts - 0 0 0 0  PHQ-9 Score - 3 0 0 4  Difficult doing work/chores - Not difficult at all - - Not difficult at all    phq 9 is negative  Fall Risk: Fall Risk  08/04/2020 02/05/2020  02/04/2019 05/23/2018 01/29/2018  Falls in the past year? 0 0 0 1 No  Number falls in past yr: 0 0 0 0 -  Injury with Fall? 0 0 0 1 -     Functional Status Survey: Is the patient deaf or have difficulty hearing?: Yes Does the patient have difficulty seeing, even when wearing glasses/contacts?: Yes Does the patient have difficulty concentrating, remembering, or making decisions?: No Does the patient have difficulty walking or climbing stairs?: No Does the patient have difficulty dressing or bathing?: No Does the patient have difficulty doing errands alone such as visiting a  doctor's office or shopping?: No    Assessment & Plan  1. Dyslipidemia   2. Metabolic syndrome  - POCT HgB N8G  3. Vitamin D deficiency  - Vitamin D, Ergocalciferol, (DRISDOL) 1.25 MG (50000 UNIT) CAPS capsule; Weekly  Dispense: 12 capsule; Refill: 1  4. Hair loss   5. Seborrheic dermatitis of scalp   6. History of iron deficiency  - Ambulatory referral to Hematology   7. Bilateral impacted cerumen  Verbal consent given Possible side effects discussed with patient Ears were  lavaged with warm water and peroxide  Patient tolerated procedure well No complications

## 2020-08-04 ENCOUNTER — Ambulatory Visit: Payer: BC Managed Care – PPO | Admitting: Family Medicine

## 2020-08-04 ENCOUNTER — Other Ambulatory Visit: Payer: Self-pay

## 2020-08-04 ENCOUNTER — Encounter: Payer: Self-pay | Admitting: Family Medicine

## 2020-08-04 VITALS — BP 138/80 | HR 85 | Temp 97.6°F | Resp 16 | Ht 63.0 in | Wt 191.0 lb

## 2020-08-04 DIAGNOSIS — L659 Nonscarring hair loss, unspecified: Secondary | ICD-10-CM

## 2020-08-04 DIAGNOSIS — E8881 Metabolic syndrome: Secondary | ICD-10-CM | POA: Diagnosis not present

## 2020-08-04 DIAGNOSIS — E785 Hyperlipidemia, unspecified: Secondary | ICD-10-CM

## 2020-08-04 DIAGNOSIS — H6123 Impacted cerumen, bilateral: Secondary | ICD-10-CM

## 2020-08-04 DIAGNOSIS — E559 Vitamin D deficiency, unspecified: Secondary | ICD-10-CM

## 2020-08-04 DIAGNOSIS — Z8639 Personal history of other endocrine, nutritional and metabolic disease: Secondary | ICD-10-CM

## 2020-08-04 DIAGNOSIS — L219 Seborrheic dermatitis, unspecified: Secondary | ICD-10-CM

## 2020-08-04 LAB — POCT GLYCOSYLATED HEMOGLOBIN (HGB A1C): Hemoglobin A1C: 6 % — AB (ref 4.0–5.6)

## 2020-08-04 MED ORDER — VITAMIN D (ERGOCALCIFEROL) 1.25 MG (50000 UNIT) PO CAPS: ORAL_CAPSULE | ORAL | 1 refills | Status: DC

## 2020-08-19 ENCOUNTER — Inpatient Hospital Stay: Payer: BC Managed Care – PPO | Attending: Oncology | Admitting: Oncology

## 2020-08-19 ENCOUNTER — Inpatient Hospital Stay: Payer: BC Managed Care – PPO

## 2020-08-19 ENCOUNTER — Encounter: Payer: Self-pay | Admitting: Oncology

## 2020-08-19 VITALS — BP 139/82 | HR 63 | Temp 97.5°F | Resp 18 | Ht 64.0 in | Wt 189.8 lb

## 2020-08-19 DIAGNOSIS — Z8249 Family history of ischemic heart disease and other diseases of the circulatory system: Secondary | ICD-10-CM | POA: Insufficient documentation

## 2020-08-19 DIAGNOSIS — R718 Other abnormality of red blood cells: Secondary | ICD-10-CM | POA: Insufficient documentation

## 2020-08-19 DIAGNOSIS — D509 Iron deficiency anemia, unspecified: Secondary | ICD-10-CM | POA: Diagnosis present

## 2020-08-19 DIAGNOSIS — Z833 Family history of diabetes mellitus: Secondary | ICD-10-CM | POA: Diagnosis not present

## 2020-08-19 LAB — TECHNOLOGIST SMEAR REVIEW
Plt Morphology: NORMAL
RBC Morphology: NORMAL
WBC Morphology: NORMAL

## 2020-08-19 LAB — CBC WITH DIFFERENTIAL/PLATELET
Abs Immature Granulocytes: 0.02 10*3/uL (ref 0.00–0.07)
Basophils Absolute: 0.1 10*3/uL (ref 0.0–0.1)
Basophils Relative: 1 %
Eosinophils Absolute: 0.2 10*3/uL (ref 0.0–0.5)
Eosinophils Relative: 3 %
HCT: 41.8 % (ref 36.0–46.0)
Hemoglobin: 13.2 g/dL (ref 12.0–15.0)
Immature Granulocytes: 0 %
Lymphocytes Relative: 38 %
Lymphs Abs: 3.4 10*3/uL (ref 0.7–4.0)
MCH: 22.6 pg — ABNORMAL LOW (ref 26.0–34.0)
MCHC: 31.6 g/dL (ref 30.0–36.0)
MCV: 71.6 fL — ABNORMAL LOW (ref 80.0–100.0)
Monocytes Absolute: 0.9 10*3/uL (ref 0.1–1.0)
Monocytes Relative: 10 %
Neutro Abs: 4.4 10*3/uL (ref 1.7–7.7)
Neutrophils Relative %: 48 %
Platelets: 268 10*3/uL (ref 150–400)
RBC: 5.84 MIL/uL — ABNORMAL HIGH (ref 3.87–5.11)
RDW: 13.8 % (ref 11.5–15.5)
WBC: 9 10*3/uL (ref 4.0–10.5)
nRBC: 0 % (ref 0.0–0.2)

## 2020-08-19 LAB — FERRITIN: Ferritin: 86 ng/mL (ref 11–307)

## 2020-08-19 LAB — IRON AND TIBC
Iron: 79 ug/dL (ref 28–170)
Saturation Ratios: 20 % (ref 10.4–31.8)
TIBC: 395 ug/dL (ref 250–450)
UIBC: 316 ug/dL

## 2020-08-19 NOTE — Progress Notes (Signed)
Hematology/Oncology Consult note [facility] [phone] [phone]   Patient Care Team: Steele Sizer, MD as PCP - General (Family Medicine)  REFERRING PROVIDER: Steele Sizer, MD CHIEF COMPLAINTS/REASON FOR VISIT:  Evaluation of history iron deficiency anemia  HISTORY OF PRESENTING ILLNESS:  Brittney Higgins is a  50 y.o.  female with PMH listed below was seen in consultation at the request of Steele Sizer, MD   for evaluation of iron deficiency anemia.   Reviewed patient's recent labs  Labs revealed anemia with hemoglobin of 13.2, MCV 71.6 Patient has a history of iron deficiency years ago which seems to have resolved.  02/05/2020, ferritin 80, iron saturation 29.MCV is chronically decreased. Patient denies any black or bloody stool. She denies any constitutional symptoms  Review of Systems  Constitutional: Negative for appetite change, chills, fatigue and fever.  HENT:   Negative for hearing loss and voice change.   Eyes: Negative for eye problems.  Respiratory: Negative for chest tightness and cough.   Cardiovascular: Negative for chest pain.  Gastrointestinal: Negative for abdominal distention, abdominal pain and blood in stool.  Endocrine: Negative for hot flashes.  Genitourinary: Negative for difficulty urinating and frequency.   Musculoskeletal: Negative for arthralgias.  Skin: Negative for itching and rash.  Neurological: Negative for extremity weakness.  Hematological: Negative for adenopathy.  Psychiatric/Behavioral: Negative for confusion.    MEDICAL HISTORY:  Past Medical History:  Diagnosis Date  . Anemia 2012   3years of iron meds  . Hyperlipidemia   . Medical history non-contributory   . Obesity   . Vaginal delivery 2001    SURGICAL HISTORY: Past Surgical History:  Procedure Laterality Date  . BREAST CYST ASPIRATION  age 60  . COLONOSCOPY WITH PROPOFOL N/A 02/18/2019   Procedure: COLONOSCOPY WITH  PROPOFOL;  Surgeon: Lucilla Lame, MD;  Location[facility] ENDOSCOPY;  Service: Endoscopy;  Laterality: N/A;  . DILITATION & CURRETTAGE/HYSTROSCOPY WITH NOVASURE ABLATION N/A 06/04/2013   Procedure: DILATATION & CURETTAGE/HYSTEROSCOPY WITH NOVASURE ABLATION WITH ATTEMPTED VERSAPOINT;  Surgeon: Lovenia Kim, MD;  Location: [facility];  Service: Gynecology;  Laterality: N/A;    SOCIAL HISTORY: Social History   Socioeconomic History  . Marital status: Married    Spouse name: Shanon Brow  . Number of children: 1  . Years of education: Not on file  . Highest education level: Master's degree (e.g., MA, MS, MEng, MEd, MSW, MBA)  Occupational History  . Occupation: assistance principal     Employer: Web designer    Comment: [employer]  Tobacco Use  . Smoking status: Never Smoker  . Smokeless tobacco: Never Used  Vaping Use  . Vaping Use: Never used  Substance and Sexual Activity  . Alcohol use: No    Alcohol/week: 0.0 standard drinks  . Drug use: No  . Sexual activity: Yes    Partners: Male    Birth control/protection: None  Other Topics Concern  . 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 Difficulty of Paying Living Expenses: Not hard at all  Food Insecurity: No Food Insecurity  . Worried About Charity fundraiser in the Last Year: Never true  . Ran Out of Food in the Last Year: Never true  Transportation Needs: No Transportation Needs  . Lack of Transportation (Medical): No  . Lack of Transportation (Non-Medical): No  Physical Activity: Inactive  .  Days of Exercise per Week: 0 days  . Minutes of Exercise per Session: 0 min  Stress: No Stress Concern Present  . Feeling of Stress : Not at all  Social Connections: Moderately Integrated  . Frequency of Communication with Friends and Family: More than three times a week  . Frequency of Social Gatherings with Friends and  Family: Once a week  . Attends Religious Services: More than 4 times per year  . Active Member of Clubs or Organizations: No  . Attends Archivist Meetings: Never  . Marital Status: Married  Human resources officer Violence: Not At Risk  . Fear of Current or Ex-Partner: No  . Emotionally Abused: No  . Physically Abused: No  . Sexually Abused: No    FAMILY HISTORY: Family History  Problem Relation Age of Onset  . Hypertension Mother   . COPD Mother   . Colon polyps Father   . Heart disease Father   . CAD Father   . AAA (abdominal aortic aneurysm) Father   . Diabetes Father   . Dementia Father   . Other Father        Kidney Failure  . Hypertension Sister   . Diabetes Sister   . Sleep apnea Sister   . Asthma Son   . Diabetes Sister   . Hypertension Sister   . Diabetes Sister   . Hypertension Sister   . Breast cancer Neg Hx     ALLERGIES:  has No Known Allergies.  MEDICATIONS:  Current Outpatient Medications  Medication Sig Dispense Refill  . clobetasol (OLUX) 0.05 % topical foam Apply topically 2 (two) times daily. 50 g 0  . Fluocinolone Acetonide Scalp 0.01 % OIL QD to scalp PRN 120 mL 2  . ketoconazole (NIZORAL) 2 % shampoo Once weekly, lather on scalp, leave on 5 minutes, rinse well. 120 mL 3  . Selenium Sulfide 2.3 % SHAM Apply 5 mLs topically 3 (three) times a week. 180 mL 2  . Vitamin D, Ergocalciferol, (DRISDOL) 1.25 MG (50000 UNIT) CAPS capsule Weekly 12 capsule 1  . Clobetasol Propionate Emulsion 0.05 % topical foam Apply to affected area on scalp every morning. (Patient not taking: Reported on 08/19/2020) 100 g 2   No current facility-administered medications for this visit.     PHYSICAL EXAMINATION: ECOG PERFORMANCE STATUS: 0 - Asymptomatic Vitals:   08/19/20 0928  BP: 139/82  Pulse: 63  Resp: 18  Temp: (!) 97.5 F (36.4 C)   Filed Weights   08/19/20 0928  Weight: 189 lb 12.8 oz (86.1 kg)    Physical Exam Constitutional:      General: She  is not in acute distress. HENT:     Head: Normocephalic and atraumatic.  Eyes:     General: No scleral icterus. Cardiovascular:     Rate and Rhythm: Normal rate and regular rhythm.     Heart sounds: Normal heart sounds.  Pulmonary:     Effort: Pulmonary effort is normal. No respiratory distress.     Breath sounds: No wheezing.  Abdominal:     General: Bowel sounds are normal. There is no distension.     Palpations: Abdomen is soft.  Musculoskeletal:        General: No deformity. Normal range of motion.     Cervical back: Normal range of motion and neck supple.  Skin:    General: Skin is warm and dry.     Findings: No erythema or rash.  Neurological:     Mental  Status: She is alert and oriented to person, place, and time. Mental status is at baseline.     Cranial Nerves: No cranial nerve deficit.     Coordination: Coordination normal.  Psychiatric:        Mood and Affect: Mood normal.       CMP Latest Ref Rng & Units 02/05/2020  Glucose 65 - 99 mg/dL 120(H)  BUN 7 - 25 mg/dL 12  Creatinine 0.50 - 1.10 mg/dL 0.90  Sodium 135 - 146 mmol/L 142  Potassium 3.5 - 5.3 mmol/L 4.4  Chloride 98 - 110 mmol/L 104  CO2 20 - 32 mmol/L 29  Calcium 8.6 - 10.2 mg/dL 9.3  Total Protein 6.1 - 8.1 g/dL 7.6  Total Bilirubin 0.2 - 1.2 mg/dL 0.5  Alkaline Phos 33 - 115 U/L -  AST 10 - 35 U/L 19  ALT 6 - 29 U/L 21   CBC Latest Ref Rng & Units 08/19/2020  WBC 4.0 - 10.5 K/uL 9.0  Hemoglobin 12.0 - 15.0 g/dL 13.2  Hematocrit 36.0 - 46.0 % 41.8  Platelets 150 - 400 K/uL 268     LABORATORY DATA:  I have reviewed the data as listed Lab Results  Component Value Date   WBC 9.0 08/19/2020   HGB 13.2 08/19/2020   HCT 41.8 08/19/2020   MCV 71.6 (L) 08/19/2020   PLT 268 08/19/2020   Recent Labs    02/05/20 0914  NA 142  K 4.4  CL 104  CO2 29  GLUCOSE 120*  BUN 12  CREATININE 0.90  CALCIUM 9.3  GFRNONAA 75  GFRAA 87  PROT 7.6  AST 19  ALT 21  BILITOT 0.5    Iron/TIBC/Ferritin/ %Sat    Component Value Date/Time   IRON 79 08/19/2020 1546   IRON 139 02/09/2014 1129   TIBC 395 08/19/2020 1546   TIBC 412 02/09/2014 1129   FERRITIN 86 08/19/2020 1546   FERRITIN 51 01/05/2015 1117   FERRITIN 20 02/09/2014 1129   IRONPCTSAT 20 08/19/2020 1546   IRONPCTSAT 29 02/05/2020 0914     RADIOGRAPHIC STUDIES: I have personally reviewed the radiological images as listed and agreed with the findings in the report. No results found.     ASSESSMENT & PLAN:  1. RBC microcytosis    Labs are reviewed and discussed with patient.  Patient has been chronically microcytic without anemia recently.  Iron panel is not consistent with iron deficiency. I suspect that patient has underlying hemoglobinopathy. I will check CBC, iron TIBC ferritin, check hemoglobinopathy evaluation.  Patient follow-up with me in 2 -3 weeks to discuss results.   Orders Placed This Encounter  Procedures  . CBC with Differential/Platelet    Standing Status:   Future    Number of Occurrences:   1    Standing Expiration Date:   08/19/2021  . Iron and TIBC    Standing Status:   Future    Number of Occurrences:   1    Standing Expiration Date:   08/19/2021  . Ferritin    Standing Status:   Future    Number of Occurrences:   1    Standing Expiration Date:   02/19/2021  . Technologist smear review    Standing Status:   Future    Number of Occurrences:   1    Standing Expiration Date:   08/19/2021  . Hgb Fractionation Cascade    Standing Status:   Future    Number of Occurrences:   1  Standing Expiration Date:   08/19/2021    All questions were answered. The patient knows to call the clinic with any problems questions or concerns.  Cc Steele Sizer, MD  Return of visit: 2 weeks Thank you for this kind referral and the opportunity to participate in the care of this patient. A copy of today's note is routed to referring provider   Earlie Server, MD, PhD Hematology  Oncology [place] [facility] 08/19/2020

## 2020-08-19 NOTE — Progress Notes (Signed)
Patient here to establish care  

## 2020-08-23 ENCOUNTER — Other Ambulatory Visit: Payer: Self-pay | Admitting: Oncology

## 2020-08-23 ENCOUNTER — Telehealth: Payer: Self-pay

## 2020-08-23 DIAGNOSIS — R718 Other abnormality of red blood cells: Secondary | ICD-10-CM

## 2020-08-23 LAB — HGB FRACTIONATION CASCADE
Hgb A2: 2 % (ref 1.8–3.2)
Hgb A: 98 % (ref 96.4–98.8)
Hgb F: 0 % (ref 0.0–2.0)
Hgb S: 0 %

## 2020-08-23 NOTE — Telephone Encounter (Signed)
-----   Message from Earlie Server, MD sent at 08/23/2020 12:01 PM EDT ----- Please get her to test additional labs. I ordered thanks. And adjust her follow up to be 2 weeks from her new lab encounter.

## 2020-08-23 NOTE — Telephone Encounter (Signed)
FYI...  I've been trying to contact pt all day and have been unsuccessful due to her VM being full. A message was sent to her via MyChart. Hopefully, she'll respond back. If not I'll try calling her again tomorrow.

## 2020-08-23 NOTE — Telephone Encounter (Signed)
Please contact pt to schedule lab encounter and move our MD follow up as requested.

## 2020-08-23 NOTE — Telephone Encounter (Signed)
FYI..  Still working on getting her sched I haven't been able to reach her due to her VM being full I'll try calling her again later

## 2020-08-25 NOTE — Telephone Encounter (Signed)
FYI.....  I'm still trying to reach her.

## 2020-08-25 NOTE — Telephone Encounter (Signed)
Pt still has not seen Mychart. 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

## 2020-08-25 NOTE — Telephone Encounter (Addendum)
FYI...  I started calling her @ 3:45 up until now. Her phone is not set up for any messages to be left and I see that she still hasn't checked  her MyChart. I'll send her another message. And will try calling her again tomorrow if  she doesn't contact the office first.

## 2020-08-26 NOTE — Telephone Encounter (Signed)
Spoke to pt and she states the best number to reach her is at her cell @ [phone].   Please schedule patient for labs on Tuesday 4/5 @ 10 and and move Mychart visit to 4/19 @ 2:15. Pt aware.   Please mail appts out to pt per her request.

## 2020-08-26 NOTE — Telephone Encounter (Signed)
FYI....  I've tried calling pt again today x4. Last call was made @ 3:51p And her phone still hasn't been set up for messages to be left. Pt hasn't checked her MyChart since 08/20/20

## 2020-08-27 NOTE — Telephone Encounter (Signed)
Done..   Pt has been sched as requested. 

## 2020-08-31 ENCOUNTER — Inpatient Hospital Stay: Payer: BC Managed Care – PPO | Attending: Oncology

## 2020-08-31 ENCOUNTER — Other Ambulatory Visit: Payer: Self-pay

## 2020-08-31 DIAGNOSIS — D649 Anemia, unspecified: Secondary | ICD-10-CM | POA: Insufficient documentation

## 2020-08-31 DIAGNOSIS — D563 Thalassemia minor: Secondary | ICD-10-CM | POA: Diagnosis not present

## 2020-08-31 DIAGNOSIS — R718 Other abnormality of red blood cells: Secondary | ICD-10-CM

## 2020-08-31 LAB — CBC WITH DIFFERENTIAL/PLATELET
Abs Immature Granulocytes: 0.02 10*3/uL (ref 0.00–0.07)
Basophils Absolute: 0 10*3/uL (ref 0.0–0.1)
Basophils Relative: 1 %
Eosinophils Absolute: 0.1 10*3/uL (ref 0.0–0.5)
Eosinophils Relative: 2 %
HCT: 43.5 % (ref 36.0–46.0)
Hemoglobin: 13.8 g/dL (ref 12.0–15.0)
Immature Granulocytes: 0 %
Lymphocytes Relative: 31 %
Lymphs Abs: 2.2 10*3/uL (ref 0.7–4.0)
MCH: 22.8 pg — ABNORMAL LOW (ref 26.0–34.0)
MCHC: 31.7 g/dL (ref 30.0–36.0)
MCV: 71.9 fL — ABNORMAL LOW (ref 80.0–100.0)
Monocytes Absolute: 0.6 10*3/uL (ref 0.1–1.0)
Monocytes Relative: 8 %
Neutro Abs: 4.2 10*3/uL (ref 1.7–7.7)
Neutrophils Relative %: 58 %
Platelets: 262 10*3/uL (ref 150–400)
RBC: 6.05 MIL/uL — ABNORMAL HIGH (ref 3.87–5.11)
RDW: 14.2 % (ref 11.5–15.5)
WBC: 7.1 10*3/uL (ref 4.0–10.5)
nRBC: 0 % (ref 0.0–0.2)

## 2020-08-31 LAB — IRON AND TIBC
Iron: 101 ug/dL (ref 28–170)
Saturation Ratios: 24 % (ref 10.4–31.8)
TIBC: 427 ug/dL (ref 250–450)
UIBC: 326 ug/dL

## 2020-08-31 LAB — FERRITIN: Ferritin: 71 ng/mL (ref 11–307)

## 2020-09-09 LAB — ALPHA-THALASSEMIA GENOTYPR

## 2020-09-10 ENCOUNTER — Telehealth: Payer: BC Managed Care – PPO | Admitting: Oncology

## 2020-09-13 ENCOUNTER — Other Ambulatory Visit: Payer: Self-pay | Admitting: Family Medicine

## 2020-09-13 DIAGNOSIS — Z1231 Encounter for screening mammogram for malignant neoplasm of breast: Secondary | ICD-10-CM

## 2020-09-14 ENCOUNTER — Encounter: Payer: Self-pay | Admitting: Oncology

## 2020-09-14 ENCOUNTER — Inpatient Hospital Stay (HOSPITAL_BASED_OUTPATIENT_CLINIC_OR_DEPARTMENT_OTHER): Payer: BC Managed Care – PPO | Admitting: Oncology

## 2020-09-14 DIAGNOSIS — R718 Other abnormality of red blood cells: Secondary | ICD-10-CM

## 2020-09-14 NOTE — Progress Notes (Signed)
HEMATOLOGY-ONCOLOGY TeleHEALTH VISIT PROGRESS NOTE  I connected with Brittney Higgins on 09/14/20  at  2:15 PM EDT by video enabled telemedicine visit and verified that I am speaking with the correct person using two identifiers. I discussed the limitations, risks, security and privacy concerns of performing an evaluation and management service by telemedicine and the availability of in-person appointments. The patient expressed understanding and agreed to proceed.   Other persons participating in the visit and their role in the encounter:  None  Patient's location: Home  Provider's location: office Chief Complaint: RBC microcytosis   INTERVAL HISTORY Brittney Higgins is a 50 y.o. female who has above history reviewed by me today presents for follow up visit for management of RBC microcytosis Problems and complaints are listed below:  Patient had blood work done and presents virtually to discuss results. Review of Systems  Constitutional: Negative for appetite change, chills, fatigue and fever.  HENT:   Negative for hearing loss and voice change.   Eyes: Negative for eye problems.  Respiratory: Negative for chest tightness and cough.   Cardiovascular: Negative for chest pain.  Gastrointestinal: Negative for abdominal distention, abdominal pain and blood in stool.  Endocrine: Negative for hot flashes.  Genitourinary: Negative for difficulty urinating and frequency.   Musculoskeletal: Negative for arthralgias.  Skin: Negative for itching and rash.  Neurological: Negative for extremity weakness.  Hematological: Negative for adenopathy.  Psychiatric/Behavioral: Negative for confusion.    Past Medical History:  Diagnosis Date  . Anemia 2012   3years of iron meds  . Hyperlipidemia   . Medical history non-contributory   . Obesity   . Vaginal delivery 2001   Past Surgical History:  Procedure Laterality Date  . BREAST CYST ASPIRATION  age 16  . COLONOSCOPY WITH PROPOFOL N/A 02/18/2019    Procedure: COLONOSCOPY WITH PROPOFOL;  Surgeon: Lucilla Lame, MD;  Location: [facility] ENDOSCOPY;  Service: Endoscopy;  Laterality: N/A;  . DILITATION & CURRETTAGE/HYSTROSCOPY WITH NOVASURE ABLATION N/A 06/04/2013   Procedure: DILATATION & CURETTAGE/HYSTEROSCOPY WITH NOVASURE ABLATION WITH ATTEMPTED VERSAPOINT;  Surgeon: Lovenia Kim, MD;  Location: [facility];  Service: Gynecology;  Laterality: N/A;    Family History  Problem Relation Age of Onset  . Hypertension Mother   . COPD Mother   . Colon polyps Father   . Heart disease Father   . CAD Father   . AAA (abdominal aortic aneurysm) Father   . Diabetes Father   . Dementia Father   . Other Father        Kidney Failure  . Hypertension Sister   . Diabetes Sister   . Sleep apnea Sister   . Asthma Son   . Diabetes Sister   . Hypertension Sister   . Diabetes Sister   . Hypertension Sister   . Breast cancer Neg Hx     Social History   Socioeconomic History  . Marital status: Married    Spouse name: Shanon Brow  . Number of children: 1  . Years of education: Not on file  . Highest education level: Master's degree (e.g., MA, MS, MEng, MEd, MSW, MBA)  Occupational History  . Occupation: assistance principal     Employer: Web designer    Comment: [employer]  Tobacco Use  . Smoking status: Never Smoker  . Smokeless tobacco: Never Used  Vaping Use  . Vaping Use: Never used  Substance and Sexual Activity  . Alcohol use: No    Alcohol/week: 0.0 standard drinks  . Drug use: No  .  Sexual activity: Yes    Partners: Male    Birth control/protection: None  Other Topics Concern  . 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 Difficulty of Paying Living Expenses: Not hard at all  Food Insecurity: No Food Insecurity  . Worried About Charity fundraiser in the Last Year: Never true  . Ran Out of Food in the Last Year: Never true   Transportation Needs: No Transportation Needs  . Lack of Transportation (Medical): No  . Lack of Transportation (Non-Medical): No  Physical Activity: Inactive  . Days of Exercise per Week: 0 days  . Minutes of Exercise per Session: 0 min  Stress: No Stress Concern Present  . Feeling of Stress : Not at all  Social Connections: Moderately Integrated  . Frequency of Communication with Friends and Family: More than three times a week  . Frequency of Social Gatherings with Friends and Family: Once a week  . Attends Religious Services: More than 4 times per year  . Active Member of Clubs or Organizations: No  . Attends Archivist Meetings: Never  . Marital Status: Married  Human resources officer Violence: Not At Risk  . Fear of Current or Ex-Partner: No  . Emotionally Abused: No  . Physically Abused: No  . Sexually Abused: No    Current Outpatient Medications on File Prior to Visit  Medication Sig Dispense Refill  . clobetasol (OLUX) 0.05 % topical foam Apply topically 2 (two) times daily. 50 g 0  . Clobetasol Propionate Emulsion 0.05 % topical foam Apply to affected area on scalp every morning. 100 g 2  . Fluocinolone Acetonide Scalp 0.01 % OIL QD to scalp PRN 120 mL 2  . ketoconazole (NIZORAL) 2 % shampoo Once weekly, lather on scalp, leave on 5 minutes, rinse well. 120 mL 3  . Selenium Sulfide 2.3 % SHAM Apply 5 mLs topically 3 (three) times a week. 180 mL 2  . Vitamin D, Ergocalciferol, (DRISDOL) 1.25 MG (50000 UNIT) CAPS capsule Weekly 12 capsule 1   No current facility-administered medications on file prior to visit.    No Known Allergies     Observations/Objective: Today's Vitals   09/14/20 1322  PainSc: 0-No pain   There is no height or weight on file to calculate BMI.  Physical Exam Neurological:     Mental Status: She is alert.     CBC    Component Value Date/Time   WBC 7.1 08/31/2020 1005   RBC 6.05 (H) 08/31/2020 1005   HGB 13.8 08/31/2020 1005   HGB  13.0 01/05/2015 1117   HCT 43.5 08/31/2020 1005   HCT 40.3 01/05/2015 1117   PLT 262 08/31/2020 1005   PLT 271 01/05/2015 1117   MCV 71.9 (L) 08/31/2020 1005   MCV 70 (L) 01/05/2015 1117   MCH 22.8 (L) 08/31/2020 1005   MCHC 31.7 08/31/2020 1005   RDW 14.2 08/31/2020 1005   RDW 14.6 01/05/2015 1117   LYMPHSABS 2.2 08/31/2020 1005   LYMPHSABS 2.9 01/05/2015 1117   MONOABS 0.6 08/31/2020 1005   EOSABS 0.1 08/31/2020 1005   EOSABS 0.2 01/05/2015 1117   BASOSABS 0.0 08/31/2020 1005   BASOSABS 0.0 01/05/2015 1117    CMP     Component Value Date/Time   NA 142 02/05/2020 0914   NA 142 01/05/2015 1117   K 4.4  02/05/2020 0914   CL 104 02/05/2020 0914   CO2 29 02/05/2020 0914   GLUCOSE 120 (H) 02/05/2020 0914   BUN 12 02/05/2020 0914   BUN 10 01/05/2015 1117   CREATININE 0.90 02/05/2020 0914   CALCIUM 9.3 02/05/2020 0914   PROT 7.6 02/05/2020 0914   PROT 7.6 01/05/2015 1117   ALBUMIN 4.5 01/26/2017 0924   ALBUMIN 4.5 01/05/2015 1117   AST 19 02/05/2020 0914   ALT 21 02/05/2020 0914   ALKPHOS 42 01/26/2017 0924   BILITOT 0.5 02/05/2020 0914   BILITOT 0.5 01/05/2015 1117   GFRNONAA 75 02/05/2020 0914   GFRAA 87 02/05/2020 0914     Assessment and Plan: 1. RBC microcytosis     Labs are reviewed and discussed with patient. Normal iron tibc and ferritin.  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 No need for intervention. Recommend first-degree relatives to be screened and recommend prenatal counseling.  Follow Up Instructions: No need for follow-up.  Recommend patient continue follow-up with primary care provider.   I discussed the assessment and treatment plan with the patient. The patient was provided an opportunity to ask questions and all were answered. The patient agreed with the plan and demonstrated an understanding of the instructions.  The patient was advised to call back or seek an in-person evaluation if the symptoms worsen or if the condition fails  to improve as anticipated.   Earlie Server, MD 09/14/2020 8:11 PM

## 2020-09-14 NOTE — Progress Notes (Signed)
No new concerns today 

## 2020-09-15 ENCOUNTER — Ambulatory Visit
Admission: RE | Admit: 2020-09-15 | Discharge: 2020-09-15 | Disposition: A | Discharge status: Home / Self Care | Payer: BC Managed Care – PPO | Source: Ambulatory Visit | Attending: Family Medicine | Admitting: Family Medicine

## 2020-09-15 ENCOUNTER — Other Ambulatory Visit: Payer: Self-pay

## 2020-09-15 DIAGNOSIS — Z1231 Encounter for screening mammogram for malignant neoplasm of breast: Secondary | ICD-10-CM | POA: Diagnosis not present

## 2021-01-14 ENCOUNTER — Other Ambulatory Visit: Payer: Self-pay | Admitting: Dermatology

## 2021-01-14 DIAGNOSIS — L669 Cicatricial alopecia, unspecified: Secondary | ICD-10-CM

## 2021-02-04 ENCOUNTER — Other Ambulatory Visit: Payer: Self-pay | Admitting: Family Medicine

## 2021-02-04 DIAGNOSIS — E559 Vitamin D deficiency, unspecified: Secondary | ICD-10-CM

## 2021-02-10 ENCOUNTER — Other Ambulatory Visit (HOSPITAL_COMMUNITY)
Admission: RE | Admit: 2021-02-10 | Discharge: 2021-02-10 | Disposition: A | Discharge status: Home / Self Care | Payer: BC Managed Care – PPO | Source: Ambulatory Visit | Attending: Family Medicine | Admitting: Family Medicine

## 2021-02-10 ENCOUNTER — Encounter: Payer: Self-pay | Admitting: Family Medicine

## 2021-02-10 ENCOUNTER — Other Ambulatory Visit: Payer: Self-pay

## 2021-02-10 ENCOUNTER — Ambulatory Visit (INDEPENDENT_AMBULATORY_CARE_PROVIDER_SITE_OTHER): Payer: BC Managed Care – PPO | Admitting: Family Medicine

## 2021-02-10 VITALS — BP 130/84 | HR 91 | Temp 99.7°F | Resp 16 | Ht 64.0 in | Wt 195.0 lb

## 2021-02-10 DIAGNOSIS — Z0000 Encounter for general adult medical examination without abnormal findings: Secondary | ICD-10-CM | POA: Diagnosis not present

## 2021-02-10 DIAGNOSIS — E785 Hyperlipidemia, unspecified: Secondary | ICD-10-CM | POA: Diagnosis not present

## 2021-02-10 DIAGNOSIS — Z124 Encounter for screening for malignant neoplasm of cervix: Secondary | ICD-10-CM

## 2021-02-10 DIAGNOSIS — L219 Seborrheic dermatitis, unspecified: Secondary | ICD-10-CM

## 2021-02-10 DIAGNOSIS — L669 Cicatricial alopecia, unspecified: Secondary | ICD-10-CM

## 2021-02-10 DIAGNOSIS — Z23 Encounter for immunization: Secondary | ICD-10-CM | POA: Diagnosis not present

## 2021-02-10 DIAGNOSIS — L918 Other hypertrophic disorders of the skin: Secondary | ICD-10-CM

## 2021-02-10 DIAGNOSIS — N912 Amenorrhea, unspecified: Secondary | ICD-10-CM

## 2021-02-10 DIAGNOSIS — E8881 Metabolic syndrome: Secondary | ICD-10-CM

## 2021-02-10 DIAGNOSIS — D563 Thalassemia minor: Secondary | ICD-10-CM

## 2021-02-10 DIAGNOSIS — L659 Nonscarring hair loss, unspecified: Secondary | ICD-10-CM

## 2021-02-10 DIAGNOSIS — E559 Vitamin D deficiency, unspecified: Secondary | ICD-10-CM

## 2021-02-10 MED ORDER — KETOCONAZOLE 2 % EX SHAM: MEDICATED_SHAMPOO | CUTANEOUS | 3 refills | Status: DC

## 2021-02-10 MED ORDER — SELENIUM SULFIDE 2.3 % EX SHAM: 5.0000 mL | MEDICATED_SHAMPOO | CUTANEOUS | 2 refills | Status: DC

## 2021-02-10 NOTE — Patient Instructions (Signed)
Preventive Care 40-50 Years Old, Female Preventive care refers to lifestyle choices and visits with your health care provider that can promote health and wellness. This includes: A yearly physical exam. This is also called an annual wellness visit. Regular dental and eye exams. Immunizations. Screening for certain conditions. Healthy lifestyle choices, such as: Eating a healthy diet. Getting regular exercise. Not using drugs or products that contain nicotine and tobacco. Limiting alcohol use. What can I expect for my preventive care visit? Physical exam Your health care provider will check your: Height and weight. These may be used to calculate your BMI (body mass index). BMI is a measurement that tells if you are at a healthy weight. Heart rate and blood pressure. Body temperature. Skin for abnormal spots. Counseling Your health care provider may ask you questions about your: Past medical problems. Family's medical history. Alcohol, tobacco, and drug use. Emotional well-being. Home life and relationship well-being. Sexual activity. Diet, exercise, and sleep habits. Work and work environment. Access to firearms. Method of birth control. Menstrual cycle. Pregnancy history. What immunizations do I need? Vaccines are usually given at various ages, according to a schedule. Your health care provider will recommend vaccines for you based on your age, medical history, and lifestyle or other factors, such as travel or where you work. What tests do I need? Blood tests Lipid and cholesterol levels. These may be checked every 5 years, or more often if you are over 50 years old. Hepatitis C test. Hepatitis B test. Screening Lung cancer screening. You may have this screening every year starting at age 55 if you have a 30-pack-year history of smoking and currently smoke or have quit within the past 15 years. Colorectal cancer screening. All adults should have this screening starting at  age 50 and continuing until age 75. Your health care provider may recommend screening at age 45 if you are at increased risk. You will have tests every 1-10 years, depending on your results and the type of screening test. Diabetes screening. This is done by checking your blood sugar (glucose) after you have not eaten for a while (fasting). You may have this done every 1-3 years. Mammogram. This may be done every 1-2 years. Talk with your health care provider about when you should start having regular mammograms. This may depend on whether you have a family history of breast cancer. BRCA-related cancer screening. This may be done if you have a family history of breast, ovarian, tubal, or peritoneal cancers. Pelvic exam and Pap test. This may be done every 3 years starting at age 21. Starting at age 30, this may be done every 5 years if you have a Pap test in combination with an HPV test. Other tests STD (sexually transmitted disease) testing, if you are at risk. Bone density scan. This is done to screen for osteoporosis. You may have this scan if you are at high risk for osteoporosis. Talk with your health care provider about your test results, treatment options, and if necessary, the need for more tests. Follow these instructions at home: Eating and drinking  Eat a diet that includes fresh fruits and vegetables, whole grains, lean protein, and low-fat dairy products. Take vitamin and mineral supplements as recommended by your health care provider. Do not drink alcohol if: Your health care provider tells you not to drink. You are pregnant, may be pregnant, or are planning to become pregnant. If you drink alcohol: Limit how much you have to 0-1 drink a day. Be   aware of how much alcohol is in your drink. In the [place] one drink equals one 12 oz bottle of beer (355 mL), one 5 oz glass of wine (148 mL), or one 1 oz glass of hard liquor (44 mL). Lifestyle Take daily care of your teeth and  gums. Brush your teeth every morning and night with fluoride toothpaste. Floss one time each day. Stay active. Exercise for at least 30 minutes 5 or more days each week. Do not use any products that contain nicotine or tobacco, such as cigarettes, e-cigarettes, and chewing tobacco. If you need help quitting, ask your health care provider. Do not use drugs. If you are sexually active, practice safe sex. Use a condom or other form of protection to prevent STIs (sexually transmitted infections). If you do not wish to become pregnant, use a form of birth control. If you plan to become pregnant, see your health care provider for a prepregnancy visit. If told by your health care provider, take low-dose aspirin daily starting at age 63. Find healthy ways to cope with stress, such as: Meditation, yoga, or listening to music. Journaling. Talking to a trusted person. Spending time with friends and family. Safety Always wear your seat belt while driving or riding in a vehicle. Do not drive: If you have been drinking alcohol. Do not ride with someone who has been drinking. When you are tired or distracted. While texting. Wear a helmet and other protective equipment during sports activities. If you have firearms in your house, make sure you follow all gun safety procedures. What's next? Visit your health care provider once a year for an annual wellness visit. Ask your health care provider how often you should have your eyes and teeth checked. Stay up to date on all vaccines. This information is not intended to replace advice given to you by your health care provider. Make sure you discuss any questions you have with your health care provider. Document Revised: 07/23/2020 Document Reviewed: 01/24/2018 Elsevier Patient Education  2022 [employer].

## 2021-02-10 NOTE — Progress Notes (Signed)
Name: Brittney Higgins   MRN: [id number]    DOB: [birth year]   Date:02/10/2021       Progress Note  Subjective  Chief Complaint  Annual Exam  HPI  Patient presents for annual CPE and follow up   Dyslipidemia: reviewed labs with patient   The 10-year ASCVD risk score (Arnett DK, et al., 2019) is: 2.9%   Values used to calculate the score:     Age: 50 years     Sex: Female     Is Non-Hispanic African American: Yes     Diabetic: No     Tobacco smoker: No     Systolic Blood Pressure: 130 mmHg     Is BP treated: No     HDL Cholesterol: 43 mg/dL     Total Cholesterol: 200 mg/dL    Metabolic syndrome: A1C one year ago was  6.4% she changed her diet and dropped to 6 %, however she has not been as consistent since last visit in March. She denies polyphagia, polydipsia or polyuria. She has a family history of diabetes - all three sister have diabetes   Chronic constipation: doing better as long as she eats a balance and healthy diet, not on medications at this time   Alpha Thalassemia: seen by Dr. Cathie Hoops, chronic microcytosis.     Seborrhea capitis: she has seen Dr. Roseanne Reno, she is doing better, she still has a bald spot but the flakiness has improved, using clobetasol foam and fluocinonide oil also nizoral shampoo. She needs refills of shampoos today   Vitamin D deficiency: taking rx vitamin D weekly   Diet: packing her lunch, being mindful of her choices but has been eating out a lot lately. She states she is a stress eater Exercise: discussed regular physical activity    Flowsheet Row Office Visit from 02/04/2019 in [facility]  AUDIT-C Score 0      Depression: Phq 9 is  negative Depression screen [facility] 2/9 02/10/2021 08/04/2020 02/05/2020 02/04/2019 05/23/2018  Decreased Interest 0 0 0 0 0  Down, Depressed, Hopeless 0 0 1 0 0  PHQ - 2 Score 0 0 1 0 0  Altered sleeping - - 0 0 0  Tired, decreased energy - - 1 0 0  Change in appetite - - 1 0 0  Feeling bad or failure about  yourself  - - 0 0 0  Trouble concentrating - - 0 0 0  Moving slowly or fidgety/restless - - 0 0 0  Suicidal thoughts - - 0 0 0  PHQ-9 Score - - 3 0 0  Difficult doing work/chores - - Not difficult at all - -   Hypertension: BP Readings from Last 3 Encounters:  02/10/21 130/84  08/19/20 139/82  08/04/20 138/80   Obesity: Wt Readings from Last 3 Encounters:  02/10/21 195 lb (88.5 kg)  08/19/20 189 lb 12.8 oz (86.1 kg)  08/04/20 191 lb (86.6 kg)   BMI Readings from Last 3 Encounters:  02/10/21 33.47 kg/m  08/19/20 32.58 kg/m  08/04/20 33.83 kg/m     Vaccines:   Shingrix: 5-64 yo and ask insurance if covered when patient above 27 yo - she wants to hold off for now  Pneumonia: educated and discussed with patient. Flu: educated and discussed with patient.  Hep C Screening: 02/04/19 STD testing and prevention (HIV/chl/gon/syphilis): 12/16/98 Intimate partner violence: negative Sexual History :she has not been sexually active , states vaginal tightness. Discussed lubrication  Menstrual History/LMP/Abnormal Bleeding: had an  Name: Brittney Higgins   MRN: [id number]    DOB: 12-Aug-1970   Date:02/10/2021       Progress Note  Subjective  Chief Complaint  Annual Exam  HPI  Patient presents for annual CPE and follow up   Dyslipidemia: reviewed labs with patient   The 10-year ASCVD risk score (Arnett DK, et al., 2019) is: 2.9%   Values used to calculate the score:     Age: 47 years     Sex: Female     Is Non-Hispanic African American: Yes     Diabetic: No     Tobacco smoker: No     Systolic Blood Pressure: 562 mmHg     Is BP treated: No     HDL Cholesterol: 43 mg/dL     Total Cholesterol: 563 mg/dL    Metabolic syndrome: S9H one year ago was  6.4% she changed her diet and dropped to 6 %, however she has not been as consistent since last visit in March. She denies polyphagia, polydipsia or polyuria. She has a family history of diabetes - all three sister have diabetes   Chronic constipation: doing better as long as she eats a balance and healthy diet, not on medications at this time   Alpha Thalassemia: seen by Dr. Tasia Catchings, chronic microcytosis.     Seborrhea capitis: she has seen Dr. Nicole Kindred, she is doing better, she still has a bald spot but the flakiness has improved, using clobetasol foam and fluocinonide oil also nizoral shampoo. She needs refills of shampoos today   Vitamin D deficiency: taking rx vitamin D weekly   Diet: packing her lunch, being mindful of her choices but has been eating out a lot lately. She states she is a stress eater Exercise: discussed regular physical activity    [place] Office Visit from 02/04/2019 in [facility]  AUDIT-C Score 0      Depression: Phq 9 is  negative Depression screen [facility] 2/9 02/10/2021 08/04/2020 02/05/2020 02/04/2019 05/23/2018  Decreased Interest 0 0 0 0 0  Down, Depressed, Hopeless 0 0 1 0 0  PHQ - 2 Score 0 0 1 0 0  Altered sleeping - - 0 0 0  Tired, decreased energy - - 1 0 0  Change in appetite - - 1 0 0  Feeling bad or failure about  yourself  - - 0 0 0  Trouble concentrating - - 0 0 0  Moving slowly or fidgety/restless - - 0 0 0  Suicidal thoughts - - 0 0 0  PHQ-9 Score - - 3 0 0  Difficult doing work/chores - - Not difficult at all - -   Hypertension: BP Readings from Last 3 Encounters:  02/10/21 130/84  08/19/20 139/82  08/04/20 138/80   Obesity: Wt Readings from Last 3 Encounters:  02/10/21 195 lb (88.5 kg)  08/19/20 189 lb 12.8 oz (86.1 kg)  08/04/20 191 lb (86.6 kg)   BMI Readings from Last 3 Encounters:  02/10/21 33.47 kg/m  08/19/20 32.58 kg/m  08/04/20 33.83 kg/m     Vaccines:   Shingrix: 45-64 yo and ask insurance if covered when patient above 76 yo - she wants to hold off for now  Pneumonia: educated and discussed with patient. Flu: educated and discussed with patient.  Hep C Screening: 02/04/19 STD testing and prevention (HIV/chl/gon/syphilis): 12/16/98 Intimate partner violence: negative Sexual History :she has not been sexually active , states vaginal tightness. Discussed lubrication  Menstrual History/LMP/Abnormal Bleeding: had an  Name: Brittney Higgins   MRN: [id number]    DOB: 12-Aug-1970   Date:02/10/2021       Progress Note  Subjective  Chief Complaint  Annual Exam  HPI  Patient presents for annual CPE and follow up   Dyslipidemia: reviewed labs with patient   The 10-year ASCVD risk score (Arnett DK, et al., 2019) is: 2.9%   Values used to calculate the score:     Age: 47 years     Sex: Female     Is Non-Hispanic African American: Yes     Diabetic: No     Tobacco smoker: No     Systolic Blood Pressure: 562 mmHg     Is BP treated: No     HDL Cholesterol: 43 mg/dL     Total Cholesterol: 563 mg/dL    Metabolic syndrome: S9H one year ago was  6.4% she changed her diet and dropped to 6 %, however she has not been as consistent since last visit in March. She denies polyphagia, polydipsia or polyuria. She has a family history of diabetes - all three sister have diabetes   Chronic constipation: doing better as long as she eats a balance and healthy diet, not on medications at this time   Alpha Thalassemia: seen by Dr. Tasia Catchings, chronic microcytosis.     Seborrhea capitis: she has seen Dr. Nicole Kindred, she is doing better, she still has a bald spot but the flakiness has improved, using clobetasol foam and fluocinonide oil also nizoral shampoo. She needs refills of shampoos today   Vitamin D deficiency: taking rx vitamin D weekly   Diet: packing her lunch, being mindful of her choices but has been eating out a lot lately. She states she is a stress eater Exercise: discussed regular physical activity    [place] Office Visit from 02/04/2019 in [facility]  AUDIT-C Score 0      Depression: Phq 9 is  negative Depression screen [facility] 2/9 02/10/2021 08/04/2020 02/05/2020 02/04/2019 05/23/2018  Decreased Interest 0 0 0 0 0  Down, Depressed, Hopeless 0 0 1 0 0  PHQ - 2 Score 0 0 1 0 0  Altered sleeping - - 0 0 0  Tired, decreased energy - - 1 0 0  Change in appetite - - 1 0 0  Feeling bad or failure about  yourself  - - 0 0 0  Trouble concentrating - - 0 0 0  Moving slowly or fidgety/restless - - 0 0 0  Suicidal thoughts - - 0 0 0  PHQ-9 Score - - 3 0 0  Difficult doing work/chores - - Not difficult at all - -   Hypertension: BP Readings from Last 3 Encounters:  02/10/21 130/84  08/19/20 139/82  08/04/20 138/80   Obesity: Wt Readings from Last 3 Encounters:  02/10/21 195 lb (88.5 kg)  08/19/20 189 lb 12.8 oz (86.1 kg)  08/04/20 191 lb (86.6 kg)   BMI Readings from Last 3 Encounters:  02/10/21 33.47 kg/m  08/19/20 32.58 kg/m  08/04/20 33.83 kg/m     Vaccines:   Shingrix: 45-64 yo and ask insurance if covered when patient above 76 yo - she wants to hold off for now  Pneumonia: educated and discussed with patient. Flu: educated and discussed with patient.  Hep C Screening: 02/04/19 STD testing and prevention (HIV/chl/gon/syphilis): 12/16/98 Intimate partner violence: negative Sexual History :she has not been sexually active , states vaginal tightness. Discussed lubrication  Menstrual History/LMP/Abnormal Bleeding: had an  Name: Brittney Higgins   MRN: [id number]    DOB: 12-Aug-1970   Date:02/10/2021       Progress Note  Subjective  Chief Complaint  Annual Exam  HPI  Patient presents for annual CPE and follow up   Dyslipidemia: reviewed labs with patient   The 10-year ASCVD risk score (Arnett DK, et al., 2019) is: 2.9%   Values used to calculate the score:     Age: 47 years     Sex: Female     Is Non-Hispanic African American: Yes     Diabetic: No     Tobacco smoker: No     Systolic Blood Pressure: 562 mmHg     Is BP treated: No     HDL Cholesterol: 43 mg/dL     Total Cholesterol: 563 mg/dL    Metabolic syndrome: S9H one year ago was  6.4% she changed her diet and dropped to 6 %, however she has not been as consistent since last visit in March. She denies polyphagia, polydipsia or polyuria. She has a family history of diabetes - all three sister have diabetes   Chronic constipation: doing better as long as she eats a balance and healthy diet, not on medications at this time   Alpha Thalassemia: seen by Dr. Tasia Catchings, chronic microcytosis.     Seborrhea capitis: she has seen Dr. Nicole Kindred, she is doing better, she still has a bald spot but the flakiness has improved, using clobetasol foam and fluocinonide oil also nizoral shampoo. She needs refills of shampoos today   Vitamin D deficiency: taking rx vitamin D weekly   Diet: packing her lunch, being mindful of her choices but has been eating out a lot lately. She states she is a stress eater Exercise: discussed regular physical activity    [place] Office Visit from 02/04/2019 in [facility]  AUDIT-C Score 0      Depression: Phq 9 is  negative Depression screen [facility] 2/9 02/10/2021 08/04/2020 02/05/2020 02/04/2019 05/23/2018  Decreased Interest 0 0 0 0 0  Down, Depressed, Hopeless 0 0 1 0 0  PHQ - 2 Score 0 0 1 0 0  Altered sleeping - - 0 0 0  Tired, decreased energy - - 1 0 0  Change in appetite - - 1 0 0  Feeling bad or failure about  yourself  - - 0 0 0  Trouble concentrating - - 0 0 0  Moving slowly or fidgety/restless - - 0 0 0  Suicidal thoughts - - 0 0 0  PHQ-9 Score - - 3 0 0  Difficult doing work/chores - - Not difficult at all - -   Hypertension: BP Readings from Last 3 Encounters:  02/10/21 130/84  08/19/20 139/82  08/04/20 138/80   Obesity: Wt Readings from Last 3 Encounters:  02/10/21 195 lb (88.5 kg)  08/19/20 189 lb 12.8 oz (86.1 kg)  08/04/20 191 lb (86.6 kg)   BMI Readings from Last 3 Encounters:  02/10/21 33.47 kg/m  08/19/20 32.58 kg/m  08/04/20 33.83 kg/m     Vaccines:   Shingrix: 45-64 yo and ask insurance if covered when patient above 76 yo - she wants to hold off for now  Pneumonia: educated and discussed with patient. Flu: educated and discussed with patient.  Hep C Screening: 02/04/19 STD testing and prevention (HIV/chl/gon/syphilis): 12/16/98 Intimate partner violence: negative Sexual History :she has not been sexually active , states vaginal tightness. Discussed lubrication  Menstrual History/LMP/Abnormal Bleeding: had an  Name: Brittney Higgins   MRN: [id number]    DOB: 12-Aug-1970   Date:02/10/2021       Progress Note  Subjective  Chief Complaint  Annual Exam  HPI  Patient presents for annual CPE and follow up   Dyslipidemia: reviewed labs with patient   The 10-year ASCVD risk score (Arnett DK, et al., 2019) is: 2.9%   Values used to calculate the score:     Age: 47 years     Sex: Female     Is Non-Hispanic African American: Yes     Diabetic: No     Tobacco smoker: No     Systolic Blood Pressure: 562 mmHg     Is BP treated: No     HDL Cholesterol: 43 mg/dL     Total Cholesterol: 563 mg/dL    Metabolic syndrome: S9H one year ago was  6.4% she changed her diet and dropped to 6 %, however she has not been as consistent since last visit in March. She denies polyphagia, polydipsia or polyuria. She has a family history of diabetes - all three sister have diabetes   Chronic constipation: doing better as long as she eats a balance and healthy diet, not on medications at this time   Alpha Thalassemia: seen by Dr. Tasia Catchings, chronic microcytosis.     Seborrhea capitis: she has seen Dr. Nicole Kindred, she is doing better, she still has a bald spot but the flakiness has improved, using clobetasol foam and fluocinonide oil also nizoral shampoo. She needs refills of shampoos today   Vitamin D deficiency: taking rx vitamin D weekly   Diet: packing her lunch, being mindful of her choices but has been eating out a lot lately. She states she is a stress eater Exercise: discussed regular physical activity    [place] Office Visit from 02/04/2019 in [facility]  AUDIT-C Score 0      Depression: Phq 9 is  negative Depression screen [facility] 2/9 02/10/2021 08/04/2020 02/05/2020 02/04/2019 05/23/2018  Decreased Interest 0 0 0 0 0  Down, Depressed, Hopeless 0 0 1 0 0  PHQ - 2 Score 0 0 1 0 0  Altered sleeping - - 0 0 0  Tired, decreased energy - - 1 0 0  Change in appetite - - 1 0 0  Feeling bad or failure about  yourself  - - 0 0 0  Trouble concentrating - - 0 0 0  Moving slowly or fidgety/restless - - 0 0 0  Suicidal thoughts - - 0 0 0  PHQ-9 Score - - 3 0 0  Difficult doing work/chores - - Not difficult at all - -   Hypertension: BP Readings from Last 3 Encounters:  02/10/21 130/84  08/19/20 139/82  08/04/20 138/80   Obesity: Wt Readings from Last 3 Encounters:  02/10/21 195 lb (88.5 kg)  08/19/20 189 lb 12.8 oz (86.1 kg)  08/04/20 191 lb (86.6 kg)   BMI Readings from Last 3 Encounters:  02/10/21 33.47 kg/m  08/19/20 32.58 kg/m  08/04/20 33.83 kg/m     Vaccines:   Shingrix: 45-64 yo and ask insurance if covered when patient above 76 yo - she wants to hold off for now  Pneumonia: educated and discussed with patient. Flu: educated and discussed with patient.  Hep C Screening: 02/04/19 STD testing and prevention (HIV/chl/gon/syphilis): 12/16/98 Intimate partner violence: negative Sexual History :she has not been sexually active , states vaginal tightness. Discussed lubrication  Menstrual History/LMP/Abnormal Bleeding: had an

## 2021-02-11 LAB — COMPLETE METABOLIC PANEL WITH GFR
AG Ratio: 1.4 (calc) (ref 1.0–2.5)
ALT: 23 U/L (ref 6–29)
AST: 22 U/L (ref 10–35)
Albumin: 4.6 g/dL (ref 3.6–5.1)
Alkaline phosphatase (APISO): 56 U/L (ref 37–153)
BUN: 10 mg/dL (ref 7–25)
CO2: 24 mmol/L (ref 20–32)
Calcium: 9.1 mg/dL (ref 8.6–10.4)
Chloride: 102 mmol/L (ref 98–110)
Creat: 0.89 mg/dL (ref 0.50–1.03)
Globulin: 3.3 g/dL (calc) (ref 1.9–3.7)
Glucose, Bld: 107 mg/dL — ABNORMAL HIGH (ref 65–99)
Potassium: 4 mmol/L (ref 3.5–5.3)
Sodium: 138 mmol/L (ref 135–146)
Total Bilirubin: 0.6 mg/dL (ref 0.2–1.2)
Total Protein: 7.9 g/dL (ref 6.1–8.1)
eGFR: 79 mL/min/{1.73_m2} (ref >=60)

## 2021-02-11 LAB — CBC WITH DIFFERENTIAL/PLATELET
Absolute Monocytes: 912 cells/uL (ref 200–950)
Basophils Absolute: 53 cells/uL (ref 0–200)
Basophils Relative: 0.7 %
Eosinophils Absolute: 91 cells/uL (ref 15–500)
Eosinophils Relative: 1.2 %
HCT: 44.1 % (ref 35.0–45.0)
Hemoglobin: 13.5 g/dL (ref 11.7–15.5)
Lymphs Abs: 1186 cells/uL (ref 850–3900)
MCH: 22.2 pg — ABNORMAL LOW (ref 27.0–33.0)
MCHC: 30.6 g/dL — ABNORMAL LOW (ref 32.0–36.0)
MCV: 72.4 fL — ABNORMAL LOW (ref 80.0–100.0)
MPV: 11.7 fL (ref 7.5–12.5)
Monocytes Relative: 12 %
Neutro Abs: 5358 cells/uL (ref 1500–7800)
Neutrophils Relative %: 70.5 %
Platelets: 295 10*3/uL (ref 140–400)
RBC: 6.09 10*6/uL — ABNORMAL HIGH (ref 3.80–5.10)
RDW: 14.8 % (ref 11.0–15.0)
Total Lymphocyte: 15.6 %
WBC: 7.6 10*3/uL (ref 3.8–10.8)

## 2021-02-11 LAB — HEMOGLOBIN A1C
Hgb A1c MFr Bld: 6.5 % of total Hgb — ABNORMAL HIGH (ref <5.7)
Mean Plasma Glucose: 140 mg/dL
eAG (mmol/L): 7.7 mmol/L

## 2021-02-11 LAB — LIPID PANEL
Cholesterol: 197 mg/dL (ref <200)
HDL: 39 mg/dL — ABNORMAL LOW (ref >=50)
LDL Cholesterol (Calc): 128 mg/dL (calc) — ABNORMAL HIGH
Non-HDL Cholesterol (Calc): 158 mg/dL (calc) — ABNORMAL HIGH (ref <130)
Total CHOL/HDL Ratio: 5.1 (calc) — ABNORMAL HIGH (ref <5.0)
Triglycerides: 179 mg/dL — ABNORMAL HIGH (ref <150)

## 2021-02-11 LAB — FSH/LH
FSH: 24.5 m[IU]/mL
LH: 8.1 m[IU]/mL

## 2021-02-11 LAB — VITAMIN D 25 HYDROXY (VIT D DEFICIENCY, FRACTURES): Vit D, 25-Hydroxy: 30 ng/mL (ref 30–100)

## 2021-02-15 LAB — CYTOLOGY - PAP
Comment: NEGATIVE
Diagnosis: NEGATIVE
High risk HPV: NEGATIVE

## 2021-08-01 ENCOUNTER — Telehealth: Payer: Self-pay | Admitting: Family Medicine

## 2021-08-01 NOTE — Telephone Encounter (Signed)
Medication Refill - Medication: Clobetasol Propionate Emulsion 0.05 % topical foam ? ?Has the patient contacted their pharmacy? No. ?Pt states when she was in for her cpe the dr asked if she needed.  She said at that time she did not. But is completely . ?(Preferred Pharmacy (with phone number or street name): [id number] [place] ? ?Has the patient been seen for an appointment in the last year OR does the patient have an upcoming appointment? Yes.   ? ?Agent: Please be advised that RX refills may take up to 3 business days. We ask that you follow-up with your pharmacy. ? ?

## 2021-08-05 ENCOUNTER — Other Ambulatory Visit: Payer: Self-pay | Admitting: Family Medicine

## 2021-08-05 MED ORDER — CLOBETASOL PROPIONATE 0.05 % EX FOAM: Freq: Two times a day (BID) | CUTANEOUS | 0 refills | Status: DC

## 2021-08-05 NOTE — Telephone Encounter (Signed)
Requested medication (s) are due for refill today:   Provider to review ? ?Requested medication (s) are on the active medication list:   Yes but from 2020 ? ?Future visit scheduled:   Yes ? ? ?Last ordered: 05/08/2019 50 g, 0 refills ? ?Non delegated refill  ? ?Requested Prescriptions  ?Pending Prescriptions Disp Refills  ? clobetasol (OLUX) 0.05 % topical foam 50 g 0  ?  Sig: Apply topically 2 (two) times daily.  ?  ? Not Delegated - Dermatology:  Corticosteroids Failed - 08/05/2021  1:51 PM  ?  ?  Failed - This refill cannot be delegated  ?  ?  Passed - Valid encounter within last 12 months  ?  Recent Outpatient Visits   ? ?      ? 5 months ago Well adult exam  ? [facility] Steele Sizer, MD  ? 1 year ago Dyslipidemia  ? [facility] Steele Sizer, MD  ? 1 year ago Metabolic syndrome  ? [facility] Steele Sizer, MD  ? 2 years ago Well adult exam  ? [facility] Steele Sizer, MD  ? 3 years ago Flu-like symptoms  ? [place], NP  ? ?  ?  ?Future Appointments   ? ?        ? In 5 days Steele Sizer, MD [facility] [place]  ? ?  ? ?  ?  ?  ? ?

## 2021-08-05 NOTE — Telephone Encounter (Signed)
Medication Refill - Medication:  ?Clobetasol Propionate Emulsion 0.05 % topical foam  ? ?Has the patient contacted their pharmacy? Yes.   ?Contact PCP ? ?Preferred Pharmacy (with phone number or street name):  ?[id number] [address] [place] [address] ?Phone:  [phone] Fax:  [phone]? ?Has the patient been seen for an appointment in the last year OR does the patient have an upcoming appointment? Yes.   ? ?Agent: Please be advised that RX refills may take up to 3 business days. We ask that you follow-up with your pharmacy. ?

## 2021-08-09 NOTE — Progress Notes (Signed)
Name: Brittney Higgins   MRN: [id number]    DOB: 05-28-1971   Date:08/10/2021 ? ?     Progress Note ? ?Subjective ? ?Chief Complaint ? ?Follow Up ? ?HPI ? ?Dyslipidemia: reviewed labs with patient ? ? The 10-year ASCVD risk score (Arnett DK, et al., 2019) is: 4.1% ?  Values used to calculate the score: ?    Age: 51 years ?    Sex: Female ?    Is Non-Hispanic African American: Yes ?    Diabetic: No ?    Tobacco smoker: No ?    Systolic Blood Pressure: 138 mmHg ?    Is BP treated: No ?    HDL Cholesterol: 39 mg/dL ?    Total Cholesterol: 197 mg/dL  ? ?Metabolic syndrome: A1C  was up to 6.5 % , but fasting not above 126, we will continue life style modifications.  She denies polyphagia, polydipsia or polyuria. She has a family history of diabetes - all three sister have diabetes. She has increase abdominal girth, low HDL and high triglycerides  ?  ?Chronic constipation: doing better as long as she eats a balance and healthy diet, not on medications at this time. Unchanged  ?  ?Alpha Thalassemia: seen by Dr. Cathie Hoops, chronic microcytosis.  Stable  ?  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 Roseanne Reno, she is doing better, she still has a bald spot but the flakiness has improved, using clobetasol foam and fluocinonide oil also nizoral shampoo. She does not want to go back to see her at this time  ? ?Vitamin D deficiency: taking rx vitamin D weekly . Last level was good at 30  ? ?Lack of focus: she states her mother went to visit her and noticed that she is constant busy and gets distracted. She was dishes and went outside to placed something in her car and noticed it was dirty so she started to vacuum the car and forgot the water running in the kitchen and flooded the kitchen. It happened again when she left the water running in the kitchen and went to take bath and forgot the water running. She works full time and is able to complete her tasks at work. 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 Two new Engineer, manufacturing systems and since Feb two new principals.  ? ?Patient Active Problem List  ? Diagnosis Date Noted  ? Alpha thalassemia minor 02/10/2021  ? RBC microcytosis 08/19/2020  ? Polyp of sigmoid colon   ? Family history of colonic polyps 02/14/2019  ? Depression 01/26/2016  ? Seborrheic dermatitis of scalp 01/08/2015  ? Metabolic syndrome 01/08/2015  ? Lipoma of arm 01/08/2015  ? History of anemia 01/01/2015  ? Dyslipidemia 01/01/2015  ? Irregular bleeding 01/01/2015  ? Obesity (BMI 30.0-34.9) 01/01/2015  ? Vitamin D deficiency 01/01/2015  ? Chronic constipation 01/01/2015  ? Fibroid 01/01/2015  ? Bunion 01/01/2015  ? Abnormal CBC 01/01/2015  ? ? ?Past Surgical History:  ?Procedure Laterality Date  ? BREAST CYST ASPIRATION  age 5  ? COLONOSCOPY WITH PROPOFOL N/A 02/18/2019  ? Procedure: COLONOSCOPY WITH PROPOFOL;  Surgeon: Midge Minium, MD;  Location: [facility];  Service: Endoscopy;  Laterality: N/A;  ? DILITATION & CURRETTAGE/HYSTROSCOPY WITH NOVASURE ABLATION N/A 06/04/2013  ? Procedure: DILATATION & CURETTAGE/HYSTEROSCOPY WITH NOVASURE ABLATION WITH ATTEMPTED VERSAPOINT;  Surgeon: Lenoard Aden, MD;  Location: [facility];  Service: Gynecology;  Laterality: N/A;  ? ? ?Family History  ?Problem Relation Age of Onset  ? Hypertension Mother   ? COPD Mother   ? Colon polyps Father   ? Heart disease Father   ? CAD Father   ? AAA (abdominal aortic aneurysm) Father   ? Diabetes Father   ? Dementia Father   ? Other Father   ?     Kidney Failure  ? Hypertension Sister   ? Diabetes Sister   ? Sleep apnea Sister   ? Asthma Son   ? Diabetes Sister   ? Hypertension Sister   ? Diabetes Sister   ? Hypertension Sister   ? Breast cancer Neg Hx   ? ? ?Social History  ? ?Tobacco Use  ? Smoking status: Never  ? Smokeless tobacco: Never  ?Substance Use Topics  ? Alcohol use: No  ?  Alcohol/week: 0.0 standard drinks  ? ? ? ?Current Outpatient Medications:  ?  clobetasol  (OLUX) 0.05 % topical foam, Apply topically 2 (two) times daily., Disp: 50 g, Rfl: 0 ?  Clobetasol Propionate Emulsion 0.05 % topical foam, Apply to affected area on scalp every morning., Disp: 100 g, Rfl: 2 ?  Fluocinolone Acetonide Scalp 0.01 % OIL, APPLY TO SCALP DAILY AS NEEDED, Disp: 118.28 mL, Rfl: 1 ?  ketoconazole (NIZORAL) 2 % shampoo, Once weekly, lather on scalp, leave on 5 minutes, rinse well., Disp: 120 mL, Rfl: 3 ?  Selenium Sulfide 2.3 % SHAM, Apply 5 mLs topically 3 (three) times a week., Disp: 180 mL, Rfl: 2 ?  Vitamin D, Ergocalciferol, (DRISDOL) 1.25 MG (50000 UNIT) CAPS capsule, TAKE 1 CAPSULE BY MOUTH ONCE A WEEK, Disp: 12 capsule, Rfl: 1 ? ?No Known Allergies ? ?I personally reviewed active problem list, medication list, allergies, family history, social history, health maintenance with the patient/caregiver today. ? ? ?ROS ? ?Constitutional: Negative for fever or weight change.  ?Respiratory: Negative for cough and shortness of breath.   ?Cardiovascular: Negative for chest pain or palpitations.  ?Gastrointestinal: Negative for abdominal pain, no bowel changes.  ?Musculoskeletal: Negative for gait problem or joint swelling.  ?Skin: Negative for rash.  ?Neurological: Negative for dizziness or headache.  ?No other specific complaints in a complete review of systems (except as listed in HPI above).  ? ?Objective ? ?Vitals:  ? 08/10/21 1548  ?BP: 138/82  ?Pulse: 96  ?Resp: 16  ?SpO2: 99%  ?Weight: 198 lb (89.8 kg)  ?Height: 5\' 3"  (1.6 m)  ? ? ?Body mass index is 35.07 kg/m?. ? ?Physical Exam ? ?Constitutional: Patient appears well-developed and well-nourished. Obese  No distress.  ?HEENT: head atraumatic, normocephalic, pupils equal and reactive to light, ears , neck supple, throat within normal limits ?Cardiovascular: Normal rate, regular rhythm and normal heart sounds.  No murmur heard. No BLE edema. ?Pulmonary/Chest: Effort normal and breath sounds normal. No respiratory distress. ?Abdominal:  Soft.  There is no tenderness. ?Psychiatric: Patient has a normal mood and affect. behavior is normal. Judgment and thought content normal.  ? ?PHQ2/9: ?Depression screen [facility] 2/9 08/10/2021 02/10/2021 08/04/2020 02/05/2020 02/04/2019  ?Decreased Interest 0 0 0 0 0  ?Down, Depressed, Hopeless 0 0 0 1 0  ?PHQ - 2 Score 0 0 0 1 0  ?Altered sleeping 0 - - 0 0  ?Tired, decreased energy 0 - - 1 0  ?Change in appetite 0 - - 1 0  ?Feeling bad or failure about yourself  0 - - 0 0  ?Trouble  concentrating 0 - - 0 0  ?Moving slowly or fidgety/restless 0 - - 0 0  ?Suicidal thoughts 0 - - 0 0  ?PHQ-9 Score 0 - - 3 0  ?Difficult doing work/chores - - - Not difficult at all -  ?  ?phq 9 is negative ? ? ?Fall Risk: ?Fall Risk  08/10/2021 02/10/2021 08/04/2020 02/05/2020 02/04/2019  ?Falls in the past year? 0 0 0 0 0  ?Number falls in past yr: 0 0 0 0 0  ?Injury with Fall? 0 0 0 0 0  ?Risk for fall due to : No Fall Risks No Fall Risks - - -  ?Follow up Falls prevention discussed Falls prevention discussed - - -  ? ? ? ? ?Functional Status Survey: ?Is the patient deaf or have difficulty hearing?: No ?Does the patient have difficulty seeing, even when wearing glasses/contacts?: No ?Does the patient have difficulty concentrating, remembering, or making decisions?: No ?Does the patient have difficulty walking or climbing stairs?: No ?Does the patient have difficulty dressing or bathing?: No ?Does the patient have difficulty doing errands alone such as visiting a doctor's office or shopping?: No ? ? ? ?Assessment & Plan ? ?1. Dyslipidemia ? ? ?2. Alpha thalassemia minor ? ? ?3. Vitamin D deficiency ? ?- Vitamin D, Ergocalciferol, (DRISDOL) 1.25 MG (50000 UNIT) CAPS capsule; TAKE 1 CAPSULE BY MOUTH ONCE A WEEK  Dispense: 12 capsule; Refill: 1 ? ?4. Metabolic syndrome ? ?Possible diabetes, discussed low carb diet ? ?5. History of iron deficiency ? ? ?6. Seborrheic dermatitis of scalp ? ?- Selenium Sulfide 2.3 % SHAM; Apply 5 mLs topically 3 (three) times a  week.  Dispense: 180 mL; Refill: 2  ? ?7. Lack of concentration ? ?Discussed likely feeling overwhelmed, discussed mindfulness and therapy. Problems only at home, not at work. She may retire next year

## 2021-08-10 ENCOUNTER — Encounter: Payer: Self-pay | Admitting: Family Medicine

## 2021-08-10 ENCOUNTER — Other Ambulatory Visit: Payer: Self-pay

## 2021-08-10 ENCOUNTER — Ambulatory Visit: Payer: BC Managed Care – PPO | Admitting: Family Medicine

## 2021-08-10 VITALS — BP 138/82 | HR 96 | Resp 16 | Ht 63.0 in | Wt 198.0 lb

## 2021-08-10 DIAGNOSIS — E8881 Metabolic syndrome: Secondary | ICD-10-CM

## 2021-08-10 DIAGNOSIS — E785 Hyperlipidemia, unspecified: Secondary | ICD-10-CM | POA: Diagnosis not present

## 2021-08-10 DIAGNOSIS — D563 Thalassemia minor: Secondary | ICD-10-CM | POA: Diagnosis not present

## 2021-08-10 DIAGNOSIS — Z8639 Personal history of other endocrine, nutritional and metabolic disease: Secondary | ICD-10-CM

## 2021-08-10 DIAGNOSIS — R41840 Attention and concentration deficit: Secondary | ICD-10-CM

## 2021-08-10 DIAGNOSIS — E559 Vitamin D deficiency, unspecified: Secondary | ICD-10-CM | POA: Diagnosis not present

## 2021-08-10 DIAGNOSIS — L219 Seborrheic dermatitis, unspecified: Secondary | ICD-10-CM

## 2021-08-10 MED ORDER — VITAMIN D (ERGOCALCIFEROL) 1.25 MG (50000 UNIT) PO CAPS: ORAL_CAPSULE | ORAL | 1 refills | Status: DC

## 2021-08-10 MED ORDER — SELENIUM SULFIDE 2.3 % EX SHAM: 5.0000 mL | MEDICATED_SHAMPOO | CUTANEOUS | 2 refills | Status: DC

## 2022-02-14 NOTE — Patient Instructions (Signed)
Preventive Care 40-51 Years Old, Female Preventive care refers to lifestyle choices and visits with your health care provider that can promote health and wellness. Preventive care visits are also called wellness exams. What can I expect for my preventive care visit? Counseling Your health care provider may ask you questions about your: Medical history, including: Past medical problems. Family medical history. Pregnancy history. Current health, including: Menstrual cycle. Method of birth control. Emotional well-being. Home life and relationship well-being. Sexual activity and sexual health. Lifestyle, including: Alcohol, nicotine or tobacco, and drug use. Access to firearms. Diet, exercise, and sleep habits. Work and work environment. Sunscreen use. Safety issues such as seatbelt and bike helmet use. Physical exam Your health care provider will check your: Height and weight. These may be used to calculate your BMI (body mass index). BMI is a measurement that tells if you are at a healthy weight. Waist circumference. This measures the distance around your waistline. This measurement also tells if you are at a healthy weight and may help predict your risk of certain diseases, such as type 2 diabetes and high blood pressure. Heart rate and blood pressure. Body temperature. Skin for abnormal spots. What immunizations do I need?  Vaccines are usually given at various ages, according to a schedule. Your health care provider will recommend vaccines for you based on your age, medical history, and lifestyle or other factors, such as travel or where you work. What tests do I need? Screening Your health care provider may recommend screening tests for certain conditions. This may include: Lipid and cholesterol levels. Diabetes screening. This is done by checking your blood sugar (glucose) after you have not eaten for a while (fasting). Pelvic exam and Pap test. Hepatitis B test. Hepatitis C  test. HIV (human immunodeficiency virus) test. STI (sexually transmitted infection) testing, if you are at risk. Lung cancer screening. Colorectal cancer screening. Mammogram. Talk with your health care provider about when you should start having regular mammograms. This may depend on whether you have a family history of breast cancer. BRCA-related cancer screening. This may be done if you have a family history of breast, ovarian, tubal, or peritoneal cancers. Bone density scan. This is done to screen for osteoporosis. Talk with your health care provider about your test results, treatment options, and if necessary, the need for more tests. Follow these instructions at home: Eating and drinking  Eat a diet that includes fresh fruits and vegetables, whole grains, lean protein, and low-fat dairy products. Take vitamin and mineral supplements as recommended by your health care provider. Do not drink alcohol if: Your health care provider tells you not to drink. You are pregnant, may be pregnant, or are planning to become pregnant. If you drink alcohol: Limit how much you have to 0-1 drink a day. Know how much alcohol is in your drink. In the [place] one drink equals one 12 oz bottle of beer (355 mL), one 5 oz glass of wine (148 mL), or one 1 oz glass of hard liquor (44 mL). Lifestyle Brush your teeth every morning and night with fluoride toothpaste. Floss one time each day. Exercise for at least 30 minutes 5 or more days each week. Do not use any products that contain nicotine or tobacco. These products include cigarettes, chewing tobacco, and vaping devices, such as e-cigarettes. If you need help quitting, ask your health care provider. Do not use drugs. If you are sexually active, practice safe sex. Use a condom or other form of protection to   prevent STIs. If you do not wish to become pregnant, use a form of birth control. If you plan to become pregnant, see your health care provider for a  prepregnancy visit. Take aspirin only as told by your health care provider. Make sure that you understand how much to take and what form to take. Work with your health care provider to find out whether it is safe and beneficial for you to take aspirin daily. Find healthy ways to manage stress, such as: Meditation, yoga, or listening to music. Journaling. Talking to a trusted person. Spending time with friends and family. Minimize exposure to UV radiation to reduce your risk of skin cancer. Safety Always wear your seat belt while driving or riding in a vehicle. Do not drive: If you have been drinking alcohol. Do not ride with someone who has been drinking. When you are tired or distracted. While texting. If you have been using any mind-altering substances or drugs. Wear a helmet and other protective equipment during sports activities. If you have firearms in your house, make sure you follow all gun safety procedures. Seek help if you have been physically or sexually abused. What's next? Visit your health care provider once a year for an annual wellness visit. Ask your health care provider how often you should have your eyes and teeth checked. Stay up to date on all vaccines. This information is not intended to replace advice given to you by your health care provider. Make sure you discuss any questions you have with your health care provider. Document Revised: 11/10/2020 Document Reviewed: 11/10/2020 Elsevier Patient Education  [place].

## 2022-02-14 NOTE — Progress Notes (Unsigned)
Name: Brittney Higgins   MRN: [id number]    DOB: 1970/06/22   Date:02/15/2022       Progress Note  Subjective  Chief Complaint  Annual Exam  HPI  Patient presents for annual CPE.  Diet: balanced  Exercise: she needs  to resume regular physical activity  Last Eye Exam: yearly eye exams Last Dental Exam: every 6 months   Flowsheet Row Office Visit from 02/10/2021 in [facility]  AUDIT-C Score 0      Depression: Phq 9 is  negative    02/15/2022    9:02 AM 08/10/2021    3:47 PM 02/10/2021    8:59 AM 08/04/2020    8:03 AM 02/05/2020    8:21 AM  Depression screen PHQ 2/9  Decreased Interest 0 0 0 0 0  Down, Depressed, Hopeless 0 0 0 0 1  PHQ - 2 Score 0 0 0 0 1  Altered sleeping 0 0   0  Tired, decreased energy 1 0   1  Change in appetite 0 0   1  Feeling bad or failure about yourself  0 0   0  Trouble concentrating 0 0   0  Moving slowly or fidgety/restless 0 0   0  Suicidal thoughts 0 0   0  PHQ-9 Score 1 0   3  Difficult doing work/chores     Not difficult at all   Hypertension: BP Readings from Last 3 Encounters:  02/15/22 118/70  08/10/21 138/82  02/10/21 130/84   Obesity: Wt Readings from Last 3 Encounters:  02/15/22 198 lb (89.8 kg)  08/10/21 198 lb (89.8 kg)  02/10/21 195 lb (88.5 kg)   BMI Readings from Last 3 Encounters:  02/15/22 33.99 kg/m  08/10/21 35.07 kg/m  02/10/21 33.47 kg/m     Vaccines:   HPV: N/A Tdap: up to date Shingrix: discussed it  Pneumonia: N/A Flu: due today  COVID-19: discussed booster    Hep C Screening: 02/04/19 STD testing and prevention (HIV/chl/gon/syphilis): 12/16/1998 Intimate partner violence: negative screen  Sexual History :she has vaginal dryness  Menstrual History/LMP/Abnormal Bleeding: s/p ablation and no bleeding since, years ago  Discussed importance of follow up if any post-menopausal bleeding: yes  Incontinence Symptoms: negative for symptoms   Breast cancer:  - Last Mammogram: 09/15/20 -  BRCA gene screening: N/A  Osteoporosis Prevention : Discussed high calcium and vitamin D supplementation, weight bearing exercises Bone density: N/A  Cervical cancer screening: 02/10/21  Skin cancer: Discussed monitoring for atypical lesions  Colorectal cancer: 02/18/19  repeat in 2025  Lung cancer:  Low Dose CT Chest recommended if Age 82-80 years, 20 pack-year currently smoking OR have quit w/in 15years. Patient does not qualify for screen   ECG: N/A  Advanced Care Planning: A voluntary discussion about advance care planning including the explanation and discussion of advance directives.  Discussed health care proxy and Living will, and the patient was able to identify a health care proxy as husband .  Patient does not have a living will and power of attorney of health care   Lipids: Lab Results  Component Value Date   CHOL 197 02/10/2021   CHOL 200 (H) 02/05/2020   CHOL 211 (H) 02/04/2019   Lab Results  Component Value Date   HDL 39 (L) 02/10/2021   HDL 43 (L) 02/05/2020   HDL 41 (L) 02/04/2019   Lab Results  Component Value Date   LDLCALC 128 (H) 02/10/2021   LDLCALC 131 (  H) 02/05/2020   LDLCALC 135 (H) 02/04/2019   Lab Results  Component Value Date   TRIG 179 (H) 02/10/2021   TRIG 147 02/05/2020   TRIG 210 (H) 02/04/2019   Lab Results  Component Value Date   CHOLHDL 5.1 (H) 02/10/2021   CHOLHDL 4.7 02/05/2020   CHOLHDL 5.1 (H) 02/04/2019   No results found for: "LDLDIRECT"  Glucose: Glucose, Bld  Date Value Ref Range Status  02/10/2021 107 (H) 65 - 99 mg/dL Final    Comment:    .            Fasting reference interval . For someone without known diabetes, a glucose value between 100 and 125 mg/dL is consistent with prediabetes and should be confirmed with a follow-up test. .   02/05/2020 120 (H) 65 - 99 mg/dL Final    Comment:    .            Fasting reference interval . For someone without known diabetes, a glucose value between 100 and 125 mg/dL  is consistent with prediabetes and should be confirmed with a follow-up test. .   02/04/2019 114 (H) 65 - 99 mg/dL Final    Comment:    .            Fasting reference interval . For someone without known diabetes, a glucose value between 100 and 125 mg/dL is consistent with prediabetes and should be confirmed with a follow-up test. .     Patient Active Problem List   Diagnosis Date Noted   Alpha thalassemia minor 02/10/2021   RBC microcytosis 08/19/2020   Polyp of sigmoid colon    Family history of colonic polyps 02/14/2019   Depression 01/26/2016   Seborrheic dermatitis of scalp 01/08/2015   Metabolic syndrome 01/08/2015   Lipoma of arm 01/08/2015   History of anemia 01/01/2015   Dyslipidemia 01/01/2015   Irregular bleeding 01/01/2015   Obesity (BMI 30.0-34.9) 01/01/2015   Vitamin D deficiency 01/01/2015   Chronic constipation 01/01/2015   Fibroid 01/01/2015   Bunion 01/01/2015   Abnormal CBC 01/01/2015    Past Surgical History:  Procedure Laterality Date   BREAST CYST ASPIRATION  age 30   COLONOSCOPY WITH PROPOFOL N/A 02/18/2019   Procedure: COLONOSCOPY WITH PROPOFOL;  Surgeon: Midge Minium, MD;  Location: [facility] ENDOSCOPY;  Service: Endoscopy;  Laterality: N/A;   DILITATION & CURRETTAGE/HYSTROSCOPY WITH NOVASURE ABLATION N/A 06/04/2013   Procedure: DILATATION & CURETTAGE/HYSTEROSCOPY WITH NOVASURE ABLATION WITH ATTEMPTED VERSAPOINT;  Surgeon: Lenoard Aden, MD;  Location: [facility] ORS;  Service: Gynecology;  Laterality: N/A;    Family History  Problem Relation Age of Onset   Hypertension Mother    COPD Mother    Colon polyps Father    Heart disease Father    CAD Father    AAA (abdominal aortic aneurysm) Father    Diabetes Father    Dementia Father    Other Father        Kidney Failure   Hypertension Sister    Diabetes Sister    Sleep apnea Sister    Asthma Son    Diabetes Sister    Hypertension Sister    Diabetes Sister    Hypertension Sister    Breast  cancer Neg Hx     Social History   Socioeconomic History   Marital status: Married    Spouse name: Onalee Hua   Number of children: 1   Years of education: Not on file   Highest education level: Master's  degree (e.g., MA, MS, MEng, MEd, MSW, MBA)  Occupational History   Occupation: assistance principal     Employer: [employer]    Comment: [employer]  Tobacco Use   Smoking status: Never   Smokeless tobacco: Never  Vaping Use   Vaping Use: Never used  Substance and Sexual Activity   Alcohol use: No    Alcohol/week: 0.0 standard drinks of alcohol   Drug use: No   Sexual activity: Yes    Partners: Male    Birth control/protection: None  Other Topics Concern   Not on file  Social History Narrative   Married, 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    Social Determinants of Corporate investment banker Strain: Low Risk  (02/15/2022)   Overall Financial Resource Strain (CARDIA)    Difficulty of Paying Living Expenses: Not hard at all  Food Insecurity: No Food Insecurity (02/15/2022)   Hunger Vital Sign    Worried About Running Out of Food in the Last Year: Never true    Ran Out of Food in the Last Year: Never true  Transportation Needs: No Transportation Needs (02/15/2022)   PRAPARE - Administrator, Civil Service (Medical): No    Lack of Transportation (Non-Medical): No  Physical Activity: Inactive (02/15/2022)   Exercise Vital Sign    Days of Exercise per Week: 0 days    Minutes of Exercise per Session: 0 min  Stress: No Stress Concern Present (02/15/2022)   [employer] of Occupational Health - Occupational Stress Questionnaire    Feeling of Stress : Only a little  Social Connections: Socially Integrated (02/15/2022)   Social Connection and Isolation Panel [NHANES]    Frequency of Communication with Friends and Family: More than three times a week    Frequency of Social Gatherings with Friends and Family: Once a week    Attends Religious Services: More than 4  times per year    Active Member of [employer] or Organizations: Yes    Attends Banker Meetings: 1 to 4 times per year    Marital Status: Married  Catering manager Violence: Not At Risk (02/15/2022)   Humiliation, Afraid, Rape, and Kick questionnaire    Fear of Current or Ex-Partner: No    Emotionally Abused: No    Physically Abused: No    Sexually Abused: No     Current Outpatient Medications:    clobetasol (OLUX) 0.05 % topical foam, Apply topically 2 (two) times daily., Disp: 50 g, Rfl: 0   Fluocinolone Acetonide Scalp 0.01 % OIL, APPLY TO SCALP DAILY AS NEEDED, Disp: 118.28 mL, Rfl: 1   ketoconazole (NIZORAL) 2 % shampoo, Once weekly, lather on scalp, leave on 5 minutes, rinse well., Disp: 120 mL, Rfl: 3   Selenium Sulfide 2.3 % SHAM, Apply 5 mLs topically 3 (three) times a week., Disp: 180 mL, Rfl: 2   Vitamin D, Ergocalciferol, (DRISDOL) 1.25 MG (50000 UNIT) CAPS capsule, TAKE 1 CAPSULE BY MOUTH ONCE A WEEK, Disp: 12 capsule, Rfl: 1  No Known Allergies   ROS  Constitutional: Negative for fever or weight change.  Respiratory: Negative for cough and shortness of breath.   Cardiovascular: Negative for chest pain or palpitations.  Gastrointestinal: Negative for abdominal pain, no bowel changes.  Musculoskeletal: Negative for gait problem or joint swelling.  Skin: Negative for rash.  Neurological: Negative for dizziness or headache.  No other specific complaints in a complete review of systems (except as listed in HPI above).  Objective  Vitals:   02/15/22 0902  BP: 118/70  Pulse: 87  Resp: 16  SpO2: 100%  Weight: 198 lb (89.8 kg)  Height: 5\' 4"  (1.626 m)    Body mass index is 33.99 kg/m.  Physical Exam  Constitutional: Patient appears well-developed and well-nourished. No distress.  HENT: Head: Normocephalic and atraumatic. Ears: B TMs ok, no erythema or effusion; Nose: Nose normal. Mouth/Throat: Oropharynx is clear and moist. No oropharyngeal exudate.   Eyes: Conjunctivae and EOM are normal. Pupils are equal, round, and reactive to light. No scleral icterus.  Neck: Normal range of motion. Neck supple. No JVD present. No thyromegaly present.  Cardiovascular: Normal rate, regular rhythm and normal heart sounds.  No murmur heard. No BLE edema. Pulmonary/Chest: Effort normal and breath sounds normal. No respiratory distress. Abdominal: Soft. Bowel sounds are normal, no distension. There is no tenderness. no masses Breast: no lumps or masses, no nipple discharge or rashes FEMALE GENITALIA:  External genitalia normal External urethra normal Vaginal vault normal without discharge or lesions Cervix normal without discharge or lesions Bimanual exam normal without masses RECTAL: no rectal masses or hemorrhoids Musculoskeletal: Normal range of motion, no joint effusions. No gross deformities Neurological: he is alert and oriented to person, place, and time. No cranial nerve deficit. Coordination, balance, strength, speech and gait are normal.  Skin: Skin is warm and dry. No rash noted. No erythema. Lipoma of right upper arm  Psychiatric: Patient has a normal mood and affect. behavior is normal. Judgment and thought content normal.    Fall Risk:    02/15/2022    9:01 AM 08/10/2021    3:47 PM 02/10/2021    8:59 AM 08/04/2020    8:03 AM 02/05/2020    8:14 AM  Fall Risk   Falls in the past year? 0 0 0 0 0  Number falls in past yr: 0 0 0 0 0  Injury with Fall? 0 0 0 0 0  Risk for fall due to : No Fall Risks No Fall Risks No Fall Risks    Follow up Falls prevention discussed Falls prevention discussed Falls prevention discussed       Functional Status Survey: Is the patient deaf or have difficulty hearing?: No Does the patient have difficulty seeing, even when wearing glasses/contacts?: No Does the patient have difficulty concentrating, remembering, or making decisions?: No Does the patient have difficulty walking or climbing stairs?: No Does the  patient have difficulty dressing or bathing?: No Does the patient have difficulty doing errands alone such as visiting a doctor's office or shopping?: No   Assessment & Plan  1. Well adult exam  - Lipid panel - COMPLETE METABOLIC PANEL WITH GFR - CBC with Differential/Platelet - Hemoglobin A1c - VITAMIN D 25 Hydroxy (Vit-D Deficiency, Fractures)  2. Needs flu shot  - Flu Vaccine QUAD 6+ mos PF IM (Fluarix Quad PF)  3. Need for shingles vaccine  She will check coverage in insurance  4. Breast cancer screening by mammogram  - MM 3D SCREEN BREAST BILATERAL; Future  5. Dyslipidemia  - Lipid panel  6. Metabolic syndrome  - Hemoglobin A1c  7. Alpha thalassemia minor  - CBC with Differential/Platelet  8. Vitamin D deficiency  - VITAMIN D 25 Hydroxy (Vit-D Deficiency, Fractures)   -USPSTF grade A and B recommendations reviewed with patient; age-appropriate recommendations, preventive care, screening tests, etc discussed and encouraged; healthy living encouraged; see AVS for patient education given to patient -Discussed importance of 150 minutes of  physical activity weekly, eat two servings of fish weekly, eat one serving of tree nuts ( cashews, pistachios, pecans, almonds.Marland Kitchen) every other day, eat 6 servings of fruit/vegetables daily and drink plenty of water and avoid sweet beverages.   -Reviewed Health Maintenance: Yes.

## 2022-02-15 ENCOUNTER — Ambulatory Visit (INDEPENDENT_AMBULATORY_CARE_PROVIDER_SITE_OTHER): Payer: BC Managed Care – PPO | Admitting: Family Medicine

## 2022-02-15 ENCOUNTER — Encounter: Payer: Self-pay | Admitting: Family Medicine

## 2022-02-15 ENCOUNTER — Ambulatory Visit: Payer: BC Managed Care – PPO | Admitting: Family Medicine

## 2022-02-15 VITALS — BP 118/70 | HR 87 | Resp 16 | Ht 64.0 in | Wt 198.0 lb

## 2022-02-15 DIAGNOSIS — D563 Thalassemia minor: Secondary | ICD-10-CM

## 2022-02-15 DIAGNOSIS — Z23 Encounter for immunization: Secondary | ICD-10-CM | POA: Diagnosis not present

## 2022-02-15 DIAGNOSIS — Z0000 Encounter for general adult medical examination without abnormal findings: Secondary | ICD-10-CM | POA: Diagnosis not present

## 2022-02-15 DIAGNOSIS — Z1231 Encounter for screening mammogram for malignant neoplasm of breast: Secondary | ICD-10-CM

## 2022-02-15 DIAGNOSIS — E785 Hyperlipidemia, unspecified: Secondary | ICD-10-CM

## 2022-02-15 DIAGNOSIS — E559 Vitamin D deficiency, unspecified: Secondary | ICD-10-CM

## 2022-02-15 DIAGNOSIS — E8881 Metabolic syndrome: Secondary | ICD-10-CM

## 2022-02-16 ENCOUNTER — Other Ambulatory Visit: Payer: Self-pay | Admitting: Family Medicine

## 2022-02-16 ENCOUNTER — Encounter: Payer: Self-pay | Admitting: Family Medicine

## 2022-02-16 LAB — CBC WITH DIFFERENTIAL/PLATELET
Absolute Monocytes: 608 cells/uL (ref 200–950)
Basophils Absolute: 68 cells/uL (ref 0–200)
Basophils Relative: 0.9 %
Eosinophils Absolute: 233 cells/uL (ref 15–500)
Eosinophils Relative: 3.1 %
HCT: 42.8 % (ref 35.0–45.0)
Hemoglobin: 13.3 g/dL (ref 11.7–15.5)
Lymphs Abs: 2843 cells/uL (ref 850–3900)
MCH: 22.5 pg — ABNORMAL LOW (ref 27.0–33.0)
MCHC: 31.1 g/dL — ABNORMAL LOW (ref 32.0–36.0)
MCV: 72.5 fL — ABNORMAL LOW (ref 80.0–100.0)
MPV: 11.8 fL (ref 7.5–12.5)
Monocytes Relative: 8.1 %
Neutro Abs: 3750 cells/uL (ref 1500–7800)
Neutrophils Relative %: 50 %
Platelets: 305 10*3/uL (ref 140–400)
RBC: 5.9 10*6/uL — ABNORMAL HIGH (ref 3.80–5.10)
RDW: 14.5 % (ref 11.0–15.0)
Total Lymphocyte: 37.9 %
WBC: 7.5 10*3/uL (ref 3.8–10.8)

## 2022-02-16 LAB — HEMOGLOBIN A1C
Hgb A1c MFr Bld: 6.8 % of total Hgb — ABNORMAL HIGH (ref <5.7)
Mean Plasma Glucose: 148 mg/dL
eAG (mmol/L): 8.2 mmol/L

## 2022-02-16 LAB — LIPID PANEL
Cholesterol: 208 mg/dL — ABNORMAL HIGH (ref <200)
HDL: 37 mg/dL — ABNORMAL LOW (ref >=50)
LDL Cholesterol (Calc): 137 mg/dL (calc) — ABNORMAL HIGH
Non-HDL Cholesterol (Calc): 171 mg/dL (calc) — ABNORMAL HIGH (ref <130)
Total CHOL/HDL Ratio: 5.6 (calc) — ABNORMAL HIGH (ref <5.0)
Triglycerides: 197 mg/dL — ABNORMAL HIGH (ref <150)

## 2022-02-16 LAB — COMPLETE METABOLIC PANEL WITH GFR
AG Ratio: 1.4 (calc) (ref 1.0–2.5)
ALT: 23 U/L (ref 6–29)
AST: 21 U/L (ref 10–35)
Albumin: 4.5 g/dL (ref 3.6–5.1)
Alkaline phosphatase (APISO): 46 U/L (ref 37–153)
BUN: 13 mg/dL (ref 7–25)
CO2: 28 mmol/L (ref 20–32)
Calcium: 9.2 mg/dL (ref 8.6–10.4)
Chloride: 103 mmol/L (ref 98–110)
Creat: 0.83 mg/dL (ref 0.50–1.03)
Globulin: 3.3 g/dL (calc) (ref 1.9–3.7)
Glucose, Bld: 135 mg/dL — ABNORMAL HIGH (ref 65–99)
Potassium: 4.9 mmol/L (ref 3.5–5.3)
Sodium: 142 mmol/L (ref 135–146)
Total Bilirubin: 0.7 mg/dL (ref 0.2–1.2)
Total Protein: 7.8 g/dL (ref 6.1–8.1)
eGFR: 85 mL/min/{1.73_m2} (ref >=60)

## 2022-02-16 LAB — VITAMIN D 25 HYDROXY (VIT D DEFICIENCY, FRACTURES): Vit D, 25-Hydroxy: 66 ng/mL (ref 30–100)

## 2022-02-17 ENCOUNTER — Other Ambulatory Visit: Payer: Self-pay

## 2022-02-17 DIAGNOSIS — L219 Seborrheic dermatitis, unspecified: Secondary | ICD-10-CM

## 2022-02-17 DIAGNOSIS — L669 Cicatricial alopecia, unspecified: Secondary | ICD-10-CM

## 2022-02-17 MED ORDER — FLUOCINOLONE ACETONIDE SCALP 0.01 % EX OIL: TOPICAL_OIL | CUTANEOUS | 1 refills | Status: DC

## 2022-02-17 MED ORDER — SELENIUM SULFIDE 2.3 % EX SHAM: 5.0000 mL | MEDICATED_SHAMPOO | CUTANEOUS | 2 refills | Status: DC

## 2022-02-17 MED ORDER — CLOBETASOL PROPIONATE 0.05 % EX FOAM: Freq: Two times a day (BID) | CUTANEOUS | 0 refills | Status: DC

## 2022-02-21 NOTE — Progress Notes (Unsigned)
Name: Brittney Higgins   MRN: [id number]    DOB: 1970-11-24   Date:02/22/2022       Progress Note  Subjective  Chief Complaint  Follow Up/Lipoma  HPI  Dyslipidemia: reviewed labs with patient, discussed risk below and the fact that now she has DM, but at this time she does not want to start medication  The 10-year ASCVD risk score (Arnett DK, et al., 2019) is: 8.7%   Values used to calculate the score:     Age: 51 years     Sex: Female     Is Non-Hispanic African American: Yes     Diabetic: Yes     Tobacco smoker: No     Systolic Blood Pressure: 528 mmHg     Is BP treated: No     HDL Cholesterol: 37 mg/dL     Total Cholesterol: 208 mg/dL    Diabetes type II: A1C of 6.5 % last year and last week A1C of 6.8 % and fasting glucose of 135. She has now a new diagnosis of DM. She has a history of dyslipidemia and HDL is low at 37, LDL is elevated 137. Discussed medications and life style modification, she would like to start with life style modification and is willing to see a dietician and increase physical activity Discussed yearly eye exam, foot exam and urine micro   Chronic constipation: she has bowel movements about once a week, no pain or discomfort, symptoms are chronic    Alpha Thalassemia: seen by Dr. Tasia Catchings, chronic microcytosis.    Seborrhea capitis: she has seen Dr. Nicole Kindred, she is doing better, she still has a bald spot but the flakiness has improved, using clobetasol foam and fluocinonide oil also nizoral shampoo. She does not want to go back to see her at this time   Vitamin D deficiency: taking rx vitamin D weekly . Last level was 66   Lipoma on right arm: she has noticed a growth on right arm years ago but now seems to be getting larger, no pain, mild discoloration over the area  Patient Active Problem List   Diagnosis Date Noted   Alpha thalassemia minor 02/10/2021   RBC microcytosis 08/19/2020   Polyp of sigmoid colon    Family history of colonic polyps 02/14/2019    Depression 01/26/2016   Seborrheic dermatitis of scalp 41/32/4401   Metabolic syndrome 02/72/5366   Lipoma of arm 01/08/2015   History of anemia 01/01/2015   Dyslipidemia 01/01/2015   Irregular bleeding 01/01/2015   Obesity (BMI 30.0-34.9) 01/01/2015   Vitamin D deficiency 01/01/2015   Chronic constipation 01/01/2015   Fibroid 01/01/2015   Bunion 01/01/2015   Abnormal CBC 01/01/2015    Past Surgical History:  Procedure Laterality Date   BREAST CYST ASPIRATION  age 84   COLONOSCOPY WITH PROPOFOL N/A 02/18/2019   Procedure: COLONOSCOPY WITH PROPOFOL;  Surgeon: Lucilla Lame, MD;  Location: [facility] ENDOSCOPY;  Service: Endoscopy;  Laterality: N/A;   DILITATION & CURRETTAGE/HYSTROSCOPY WITH NOVASURE ABLATION N/A 06/04/2013   Procedure: DILATATION & CURETTAGE/HYSTEROSCOPY WITH NOVASURE ABLATION WITH ATTEMPTED VERSAPOINT;  Surgeon: Lovenia Kim, MD;  Location: [facility];  Service: Gynecology;  Laterality: N/A;    Family History  Problem Relation Age of Onset   Hypertension Mother    COPD Mother    Colon polyps Father    Heart disease Father    CAD Father    AAA (abdominal aortic aneurysm) Father    Diabetes Father    Dementia Father  Name: Brittney Higgins   MRN: [id number]    DOB: 02/11/1971   Date:02/22/2022       Progress Note  Subjective  Chief Complaint  Follow Up/Lipoma  HPI  Dyslipidemia: reviewed labs with patient, discussed risk below and the fact that now she has DM, but at this time she does not want to start medication  The 10-year ASCVD risk score (Arnett DK, et al., 2019) is: 8.7%   Values used to calculate the score:     Age: 51 years     Sex: Female     Is Non-Hispanic African American: Yes     Diabetic: Yes     Tobacco smoker: No     Systolic Blood Pressure: 128 mmHg     Is BP treated: No     HDL Cholesterol: 37 mg/dL     Total Cholesterol: 208 mg/dL    Diabetes type II: A1C of 6.5 % last year and last week A1C of 6.8 % and fasting glucose of 135. She has now a new diagnosis of DM. She has a history of dyslipidemia and HDL is low at 37, LDL is elevated 137. Discussed medications and life style modification, she would like to start with life style modification and is willing to see a dietician and increase physical activity Discussed yearly eye exam, foot exam and urine micro   Chronic constipation: she has bowel movements about once a week, no pain or discomfort, symptoms are chronic    Alpha Thalassemia: seen by Dr. Yu, chronic microcytosis.    Seborrhea capitis: she has seen Dr. Stewart, she is doing better, she still has a bald spot but the flakiness has improved, using clobetasol foam and fluocinonide oil also nizoral shampoo. She does not want to go back to see her at this time   Vitamin D deficiency: taking rx vitamin D weekly . Last level was 66   Lipoma on right arm: she has noticed a growth on right arm years ago but now seems to be getting larger, no pain, mild discoloration over the area  Patient Active Problem List   Diagnosis Date Noted   Alpha thalassemia minor 02/10/2021   RBC microcytosis 08/19/2020   Polyp of sigmoid colon    Family history of colonic polyps 02/14/2019    Depression 01/26/2016   Seborrheic dermatitis of scalp 01/08/2015   Metabolic syndrome 01/08/2015   Lipoma of arm 01/08/2015   History of anemia 01/01/2015   Dyslipidemia 01/01/2015   Irregular bleeding 01/01/2015   Obesity (BMI 30.0-34.9) 01/01/2015   Vitamin D deficiency 01/01/2015   Chronic constipation 01/01/2015   Fibroid 01/01/2015   Bunion 01/01/2015   Abnormal CBC 01/01/2015    Past Surgical History:  Procedure Laterality Date   BREAST CYST ASPIRATION  age 40   COLONOSCOPY WITH PROPOFOL N/A 02/18/2019   Procedure: COLONOSCOPY WITH PROPOFOL;  Surgeon: Wohl, Darren, MD;  Location: [facility] ENDOSCOPY;  Service: Endoscopy;  Laterality: N/A;   DILITATION & CURRETTAGE/HYSTROSCOPY WITH NOVASURE ABLATION N/A 06/04/2013   Procedure: DILATATION & CURETTAGE/HYSTEROSCOPY WITH NOVASURE ABLATION WITH ATTEMPTED VERSAPOINT;  Surgeon: Richard J Taavon, MD;  Location: [facility];  Service: Gynecology;  Laterality: N/A;    Family History  Problem Relation Age of Onset   Hypertension Mother    COPD Mother    Colon polyps Father    Heart disease Father    CAD Father    AAA (abdominal aortic aneurysm) Father    Diabetes Father    Dementia Father      Name: Brittney Higgins   MRN: [id number]    DOB: 02/11/1971   Date:02/22/2022       Progress Note  Subjective  Chief Complaint  Follow Up/Lipoma  HPI  Dyslipidemia: reviewed labs with patient, discussed risk below and the fact that now she has DM, but at this time she does not want to start medication  The 10-year ASCVD risk score (Arnett DK, et al., 2019) is: 8.7%   Values used to calculate the score:     Age: 51 years     Sex: Female     Is Non-Hispanic African American: Yes     Diabetic: Yes     Tobacco smoker: No     Systolic Blood Pressure: 128 mmHg     Is BP treated: No     HDL Cholesterol: 37 mg/dL     Total Cholesterol: 208 mg/dL    Diabetes type II: A1C of 6.5 % last year and last week A1C of 6.8 % and fasting glucose of 135. She has now a new diagnosis of DM. She has a history of dyslipidemia and HDL is low at 37, LDL is elevated 137. Discussed medications and life style modification, she would like to start with life style modification and is willing to see a dietician and increase physical activity Discussed yearly eye exam, foot exam and urine micro   Chronic constipation: she has bowel movements about once a week, no pain or discomfort, symptoms are chronic    Alpha Thalassemia: seen by Dr. Yu, chronic microcytosis.    Seborrhea capitis: she has seen Dr. Stewart, she is doing better, she still has a bald spot but the flakiness has improved, using clobetasol foam and fluocinonide oil also nizoral shampoo. She does not want to go back to see her at this time   Vitamin D deficiency: taking rx vitamin D weekly . Last level was 66   Lipoma on right arm: she has noticed a growth on right arm years ago but now seems to be getting larger, no pain, mild discoloration over the area  Patient Active Problem List   Diagnosis Date Noted   Alpha thalassemia minor 02/10/2021   RBC microcytosis 08/19/2020   Polyp of sigmoid colon    Family history of colonic polyps 02/14/2019    Depression 01/26/2016   Seborrheic dermatitis of scalp 01/08/2015   Metabolic syndrome 01/08/2015   Lipoma of arm 01/08/2015   History of anemia 01/01/2015   Dyslipidemia 01/01/2015   Irregular bleeding 01/01/2015   Obesity (BMI 30.0-34.9) 01/01/2015   Vitamin D deficiency 01/01/2015   Chronic constipation 01/01/2015   Fibroid 01/01/2015   Bunion 01/01/2015   Abnormal CBC 01/01/2015    Past Surgical History:  Procedure Laterality Date   BREAST CYST ASPIRATION  age 40   COLONOSCOPY WITH PROPOFOL N/A 02/18/2019   Procedure: COLONOSCOPY WITH PROPOFOL;  Surgeon: Wohl, Darren, MD;  Location: [facility] ENDOSCOPY;  Service: Endoscopy;  Laterality: N/A;   DILITATION & CURRETTAGE/HYSTROSCOPY WITH NOVASURE ABLATION N/A 06/04/2013   Procedure: DILATATION & CURETTAGE/HYSTEROSCOPY WITH NOVASURE ABLATION WITH ATTEMPTED VERSAPOINT;  Surgeon: Richard J Taavon, MD;  Location: [facility];  Service: Gynecology;  Laterality: N/A;    Family History  Problem Relation Age of Onset   Hypertension Mother    COPD Mother    Colon polyps Father    Heart disease Father    CAD Father    AAA (abdominal aortic aneurysm) Father    Diabetes Father    Dementia Father      Name: Brittney Higgins   MRN: [id number]    DOB: 02/11/1971   Date:02/22/2022       Progress Note  Subjective  Chief Complaint  Follow Up/Lipoma  HPI  Dyslipidemia: reviewed labs with patient, discussed risk below and the fact that now she has DM, but at this time she does not want to start medication  The 10-year ASCVD risk score (Arnett DK, et al., 2019) is: 8.7%   Values used to calculate the score:     Age: 51 years     Sex: Female     Is Non-Hispanic African American: Yes     Diabetic: Yes     Tobacco smoker: No     Systolic Blood Pressure: 128 mmHg     Is BP treated: No     HDL Cholesterol: 37 mg/dL     Total Cholesterol: 208 mg/dL    Diabetes type II: A1C of 6.5 % last year and last week A1C of 6.8 % and fasting glucose of 135. She has now a new diagnosis of DM. She has a history of dyslipidemia and HDL is low at 37, LDL is elevated 137. Discussed medications and life style modification, she would like to start with life style modification and is willing to see a dietician and increase physical activity Discussed yearly eye exam, foot exam and urine micro   Chronic constipation: she has bowel movements about once a week, no pain or discomfort, symptoms are chronic    Alpha Thalassemia: seen by Dr. Yu, chronic microcytosis.    Seborrhea capitis: she has seen Dr. Stewart, she is doing better, she still has a bald spot but the flakiness has improved, using clobetasol foam and fluocinonide oil also nizoral shampoo. She does not want to go back to see her at this time   Vitamin D deficiency: taking rx vitamin D weekly . Last level was 66   Lipoma on right arm: she has noticed a growth on right arm years ago but now seems to be getting larger, no pain, mild discoloration over the area  Patient Active Problem List   Diagnosis Date Noted   Alpha thalassemia minor 02/10/2021   RBC microcytosis 08/19/2020   Polyp of sigmoid colon    Family history of colonic polyps 02/14/2019    Depression 01/26/2016   Seborrheic dermatitis of scalp 01/08/2015   Metabolic syndrome 01/08/2015   Lipoma of arm 01/08/2015   History of anemia 01/01/2015   Dyslipidemia 01/01/2015   Irregular bleeding 01/01/2015   Obesity (BMI 30.0-34.9) 01/01/2015   Vitamin D deficiency 01/01/2015   Chronic constipation 01/01/2015   Fibroid 01/01/2015   Bunion 01/01/2015   Abnormal CBC 01/01/2015    Past Surgical History:  Procedure Laterality Date   BREAST CYST ASPIRATION  age 40   COLONOSCOPY WITH PROPOFOL N/A 02/18/2019   Procedure: COLONOSCOPY WITH PROPOFOL;  Surgeon: Wohl, Darren, MD;  Location: [facility] ENDOSCOPY;  Service: Endoscopy;  Laterality: N/A;   DILITATION & CURRETTAGE/HYSTROSCOPY WITH NOVASURE ABLATION N/A 06/04/2013   Procedure: DILATATION & CURETTAGE/HYSTEROSCOPY WITH NOVASURE ABLATION WITH ATTEMPTED VERSAPOINT;  Surgeon: Richard J Taavon, MD;  Location: [facility];  Service: Gynecology;  Laterality: N/A;    Family History  Problem Relation Age of Onset   Hypertension Mother    COPD Mother    Colon polyps Father    Heart disease Father    CAD Father    AAA (abdominal aortic aneurysm) Father    Diabetes Father    Dementia Father      Name: Brittney Higgins   MRN: [id number]    DOB: 1970-11-24   Date:02/22/2022       Progress Note  Subjective  Chief Complaint  Follow Up/Lipoma  HPI  Dyslipidemia: reviewed labs with patient, discussed risk below and the fact that now she has DM, but at this time she does not want to start medication  The 10-year ASCVD risk score (Arnett DK, et al., 2019) is: 8.7%   Values used to calculate the score:     Age: 51 years     Sex: Female     Is Non-Hispanic African American: Yes     Diabetic: Yes     Tobacco smoker: No     Systolic Blood Pressure: 528 mmHg     Is BP treated: No     HDL Cholesterol: 37 mg/dL     Total Cholesterol: 208 mg/dL    Diabetes type II: A1C of 6.5 % last year and last week A1C of 6.8 % and fasting glucose of 135. She has now a new diagnosis of DM. She has a history of dyslipidemia and HDL is low at 37, LDL is elevated 137. Discussed medications and life style modification, she would like to start with life style modification and is willing to see a dietician and increase physical activity Discussed yearly eye exam, foot exam and urine micro   Chronic constipation: she has bowel movements about once a week, no pain or discomfort, symptoms are chronic    Alpha Thalassemia: seen by Dr. Tasia Catchings, chronic microcytosis.    Seborrhea capitis: she has seen Dr. Nicole Kindred, she is doing better, she still has a bald spot but the flakiness has improved, using clobetasol foam and fluocinonide oil also nizoral shampoo. She does not want to go back to see her at this time   Vitamin D deficiency: taking rx vitamin D weekly . Last level was 66   Lipoma on right arm: she has noticed a growth on right arm years ago but now seems to be getting larger, no pain, mild discoloration over the area  Patient Active Problem List   Diagnosis Date Noted   Alpha thalassemia minor 02/10/2021   RBC microcytosis 08/19/2020   Polyp of sigmoid colon    Family history of colonic polyps 02/14/2019    Depression 01/26/2016   Seborrheic dermatitis of scalp 41/32/4401   Metabolic syndrome 02/72/5366   Lipoma of arm 01/08/2015   History of anemia 01/01/2015   Dyslipidemia 01/01/2015   Irregular bleeding 01/01/2015   Obesity (BMI 30.0-34.9) 01/01/2015   Vitamin D deficiency 01/01/2015   Chronic constipation 01/01/2015   Fibroid 01/01/2015   Bunion 01/01/2015   Abnormal CBC 01/01/2015    Past Surgical History:  Procedure Laterality Date   BREAST CYST ASPIRATION  age 84   COLONOSCOPY WITH PROPOFOL N/A 02/18/2019   Procedure: COLONOSCOPY WITH PROPOFOL;  Surgeon: Lucilla Lame, MD;  Location: [facility] ENDOSCOPY;  Service: Endoscopy;  Laterality: N/A;   DILITATION & CURRETTAGE/HYSTROSCOPY WITH NOVASURE ABLATION N/A 06/04/2013   Procedure: DILATATION & CURETTAGE/HYSTEROSCOPY WITH NOVASURE ABLATION WITH ATTEMPTED VERSAPOINT;  Surgeon: Lovenia Kim, MD;  Location: [facility];  Service: Gynecology;  Laterality: N/A;    Family History  Problem Relation Age of Onset   Hypertension Mother    COPD Mother    Colon polyps Father    Heart disease Father    CAD Father    AAA (abdominal aortic aneurysm) Father    Diabetes Father    Dementia Father

## 2022-02-22 ENCOUNTER — Encounter: Payer: Self-pay | Admitting: Family Medicine

## 2022-02-22 ENCOUNTER — Ambulatory Visit (INDEPENDENT_AMBULATORY_CARE_PROVIDER_SITE_OTHER): Payer: BC Managed Care – PPO | Admitting: Family Medicine

## 2022-02-22 VITALS — BP 128/82 | HR 79 | Resp 16 | Ht 64.0 in | Wt 196.0 lb

## 2022-02-22 DIAGNOSIS — L6681 Central centrifugal cicatricial alopecia: Secondary | ICD-10-CM | POA: Insufficient documentation

## 2022-02-22 DIAGNOSIS — L219 Seborrheic dermatitis, unspecified: Secondary | ICD-10-CM | POA: Diagnosis not present

## 2022-02-22 DIAGNOSIS — E559 Vitamin D deficiency, unspecified: Secondary | ICD-10-CM

## 2022-02-22 DIAGNOSIS — E1169 Type 2 diabetes mellitus with other specified complication: Secondary | ICD-10-CM | POA: Diagnosis not present

## 2022-02-22 DIAGNOSIS — L669 Cicatricial alopecia, unspecified: Secondary | ICD-10-CM | POA: Diagnosis not present

## 2022-02-22 DIAGNOSIS — D1721 Benign lipomatous neoplasm of skin and subcutaneous tissue of right arm: Secondary | ICD-10-CM | POA: Diagnosis not present

## 2022-02-22 DIAGNOSIS — E785 Hyperlipidemia, unspecified: Secondary | ICD-10-CM

## 2022-02-22 DIAGNOSIS — D563 Thalassemia minor: Secondary | ICD-10-CM

## 2022-02-22 MED ORDER — KETOCONAZOLE 2 % EX SHAM: MEDICATED_SHAMPOO | CUTANEOUS | 3 refills | Status: DC

## 2022-02-23 LAB — MICROALBUMIN / CREATININE URINE RATIO
Creatinine, Urine: 179 mg/dL (ref 20–275)
Microalb Creat Ratio: 43 mcg/mg creat — ABNORMAL HIGH (ref <30)
Microalb, Ur: 7.7 mg/dL

## 2022-03-01 NOTE — Progress Notes (Unsigned)
Patient ID: Brittney Higgins, female   DOB: Sep 18, 1970, 51 y.o.   MRN: [id number]  Chief Complaint: Soft tissue mass right arm  History of Present Illness Brittney Higgins is a 51 y.o. female with a known soft tissue mass of the right distal lateral arm present for decades.  No significant changes she can recall.  Possibly a little larger as she has gained some weight.  Denies any tenderness or pain or disability in terms of range of motion of the elbow.  Denies concerns about its appearance.  Past Medical History Past Medical History:  Diagnosis Date   Anemia 2012   3years of iron meds   Hyperlipidemia    Medical history non-contributory    Obesity    Vaginal delivery 2001      Past Surgical History:  Procedure Laterality Date   BREAST CYST ASPIRATION  age 59   COLONOSCOPY WITH PROPOFOL N/A 02/18/2019   Procedure: COLONOSCOPY WITH PROPOFOL;  Surgeon: Midge Minium, MD;  Location: [facility] END[facility]COPY;  Service: Endoscopy;  Laterality: N/A;   DILITATION & CURRETTAGE/HYSTROSCOPY WITH NOVASURE ABLATION N/A 06/04/2013   Procedure: DILATATION & CURETTAGE/HYSTEROSCOPY WITH NOVASURE ABLATION WITH ATTEMPTED VERSAPOINT;  Surgeon: Lenoard Aden, MD;  Location: [facility];  Service: Gynecology;  Laterality: N/A;    No Known Allergies  Current Outpatient Medications  Medication Sig Dispense Refill   clobetasol (OLUX) 0.05 % topical foam Apply topically 2 (two) times daily. 50 g 0   Fluocinolone Acetonide Scalp 0.01 % OIL APPLY TO SCALP DAILY AS NEEDED 118.28 mL 1   ketoconazole (NIZORAL) 2 % shampoo Once weekly, lather on scalp, leave on 5 minutes, rinse well. 120 mL 3   Selenium Sulfide 2.3 % SHAM Apply 5 mLs topically 3 (three) times a week. 180 mL 2   Vitamin D, Ergocalciferol, (DRISDOL) 1.25 MG (50000 UNIT) CAPS capsule TAKE 1 CAPSULE BY MOUTH ONCE A WEEK 12 capsule 1   No current facility-administered medications for this visit.    Family History Family History  Problem Relation Age of Onset    Hypertension Mother    COPD Mother    Colon polyps Father    Heart disease Father    CAD Father    AAA (abdominal aortic aneurysm) Father    Diabetes Father    Dementia Father    Other Father        Kidney Failure   Hypertension Sister    Diabetes Sister    Sleep apnea Sister    Asthma Son    Diabetes Sister    Hypertension Sister    Diabetes Sister    Hypertension Sister    Breast cancer Neg Hx       Social History Social History   Tobacco Use   Smoking status: Never    Passive exposure: Never   Smokeless tobacco: Never  Vaping Use   Vaping Use: Never used  Substance Use Topics   Alcohol use: No    Alcohol/week: 0.0 standard drinks of alcohol   Drug use: No        Review of Systems  Constitutional: Negative.   HENT: Negative.    Eyes: Negative.   Respiratory: Negative.    Cardiovascular: Negative.   Gastrointestinal: Negative.   Genitourinary: Negative.   Skin: Negative.   Neurological: Negative.   Psychiatric/Behavioral: Negative.        Physical Exam Blood pressure (!) 146/84, pulse 67, temperature 98 F (36.7 C), height 5\' 4"  (1.626 m), weight 195 lb (88.5  kg), SpO2 100 %. Last Weight  Most recent update: 03/02/2022  3:25 PM    Weight  88.5 kg (195 lb)             CONSTITUTIONAL: Well developed, and nourished, appropriately responsive and aware without distress.   EYES: Sclera non-icteric.   EARS, NOSE, MOUTH AND THROAT:  The oropharynx is clear. Oral mucosa is pink and moist.   Hearing is intact to voice.  NECK: Trachea is midline, and there is no jugular venous distension.  LYMPH NODES:  Lymph nodes in the neck are not enlarged. RESPIRATORY:  Normal respiratory effort without pathologic use of accessory muscles. MUSCULOSKELETAL:  Symmetrical muscle tone appreciated in all four extremities.    SKIN: Skin turgor is normal. No pathologic skin lesions appreciated.  On the right upper extremity just above the antecubital fossa on the lateral  aspect of the anterior arm is a soft tissue mass that is approximately 6 to 7 cm in diameter, fairly indistinct edges, and not really mobile.  It is fleshy and consistent with a lipomatous etiology. NEUROLOGIC:  Motor and sensation appear grossly normal.  Cranial nerves are grossly without defect. PSYCH:  Alert and oriented to person, place and time. Affect is appropriate for situation.  Data Reviewed I have personally reviewed what is currently available of the patient's imaging, recent labs and medical records.   Labs:     Latest Ref Rng & Units 02/15/2022    9:44 AM 02/10/2021   10:03 AM 08/31/2020   10:05 AM  CBC  WBC 3.8 - 10.8 Thousand/uL 7.5  7.6  7.1   Hemoglobin 11.7 - 15.5 g/dL 34.7  42.5  95.6   Hematocrit 35.0 - 45.0 % 42.8  44.1  43.5   Platelets 140 - 400 Thousand/uL 305  295  262       Latest Ref Rng & Units 02/15/2022    9:44 AM 02/10/2021   10:03 AM 02/05/2020    9:14 AM  CMP  Glucose 65 - 99 mg/dL 387  564  332   BUN 7 - 25 mg/dL 13  10  12    Creatinine 0.50 - 1.03 mg/dL 9.51  8.84  1.66   Sodium 135 - 146 mmol/L 142  138  142   Potassium 3.5 - 5.3 mmol/L 4.9  4.0  4.4   Chloride 98 - 110 mmol/L 103  102  104   CO2 20 - 32 mmol/L 28  24  29    Calcium 8.6 - 10.4 mg/dL 9.2  9.1  9.3   Total Protein 6.1 - 8.1 g/dL 7.8  7.9  7.6   Total Bilirubin 0.2 - 1.2 mg/dL 0.7  0.6  0.5   AST 10 - 35 U/L 21  22  19    ALT 6 - 29 U/L 23  23  21        Imaging:  Within last 24 hrs: No results found.  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 We discussed the pros and cons of additional imaging including MRI, and the limits of what we can know from that study.  We discussed the unlikely nature of this being a low-grade sarcoma, but we can never be 100% certain.  We discussed the likelihood of a scar and replacement of the smooth mass which really does not stand out. I believe she has elected to continue observation, and we remain readily available to assist her should there be any change in what she wishes to pursue.  Face-to-face time spent with the patient and accompanying care providers(if present) was 20 minutes, with more than 50% of the time spent counseling, educating, and coordinating care of the patient.    These notes generated with voice recognition software. I apologize for typographical errors.  Campbell Lerner M.D., FACS 03/02/2022, 4:04 PM

## 2022-03-02 ENCOUNTER — Ambulatory Visit: Payer: BC Managed Care – PPO | Admitting: Surgery

## 2022-03-02 ENCOUNTER — Encounter: Payer: Self-pay | Admitting: Surgery

## 2022-03-02 VITALS — BP 146/84 | HR 67 | Temp 98.0°F | Ht 64.0 in | Wt 195.0 lb

## 2022-03-02 DIAGNOSIS — D1721 Benign lipomatous neoplasm of skin and subcutaneous tissue of right arm: Secondary | ICD-10-CM

## 2022-03-02 NOTE — Patient Instructions (Signed)
Let us know if you would like to see about having this area removed or if you notice any changes.   Lipoma  A lipoma is a noncancerous (benign) tumor that is made up of fat cells. This is a very common type of soft-tissue growth. Lipomas are usually found under the skin (subcutaneous). They may occur in any tissue of the body that contains fat. Common areas for lipomas to appear include the back, arms, shoulders, buttocks, and thighs. Lipomas grow slowly, and they are usually painless. Most lipomas do not cause problems and do not require treatment. What are the causes? The cause of this condition is not known. What increases the risk? You are more likely to develop this condition if: You are 34-72 years old. You have a family history of lipomas. What are the signs or symptoms? A lipoma usually appears as a small, round bump under the skin. In most cases, the lump will: Feel soft or rubbery. Not cause pain or other symptoms. However, if a lipoma is located in an area where it pushes on nerves, it can become painful or cause other symptoms. How is this diagnosed? A lipoma can usually be diagnosed with a physical exam. You may also have tests to confirm the diagnosis and to rule out other conditions. Tests may include: Imaging tests, such as a CT scan or an MRI. Removal of a tissue sample to be looked at under a microscope (biopsy). How is this treated? Treatment for this condition depends on the size of the lipoma and whether it is causing any symptoms. For small lipomas that are not causing problems, no treatment is needed. If a lipoma is bigger or it causes problems, surgery may be done to remove the lipoma. Lipomas can also be removed to improve appearance. Most often, the procedure is done after applying a medicine that numbs the area (local anesthetic). Liposuction may be done to reduce the size of the lipoma before it is removed through surgery, or it may be done to remove the lipoma.  Lipomas are removed with this method to limit incision size and scarring. A liposuction tube is inserted through a small incision into the lipoma, and the contents of the lipoma are removed through the tube with suction. Follow these instructions at home: Watch your lipoma for any changes. Keep all follow-up visits. This is important. Where to find more information OrthoInfo: orthoinfo.aaos.org Contact a health care provider if: Your lipoma becomes larger or hard. Your lipoma becomes painful, red, or increasingly swollen. These could be signs of infection or a more serious condition. Get help right away if: You develop tingling or numbness in an area near the lipoma. This could indicate that the lipoma is causing nerve damage. Summary A lipoma is a noncancerous tumor that is made up of fat cells. Most lipomas do not cause problems and do not require treatment. If a lipoma is bigger or it causes problems, surgery may be done to remove the lipoma. Contact a health care provider if your lipoma becomes larger or hard, or if it becomes painful, red, or increasingly swollen. These could be signs of infection or a more serious condition. This information is not intended to replace advice given to you by your health care provider. Make sure you discuss any questions you have with your health care provider. Document Revised: 06/03/2021 Document Reviewed: 06/03/2021 Elsevier Patient Education  [place].

## 2022-03-24 ENCOUNTER — Telehealth: Payer: BC Managed Care – PPO | Admitting: *Deleted

## 2022-04-25 ENCOUNTER — Encounter: Payer: BC Managed Care – PPO | Attending: Family Medicine | Admitting: *Deleted

## 2022-04-25 ENCOUNTER — Encounter: Payer: Self-pay | Admitting: *Deleted

## 2022-04-25 VITALS — BP 150/90 | Ht 65.0 in | Wt 195.1 lb

## 2022-04-25 DIAGNOSIS — E119 Type 2 diabetes mellitus without complications: Secondary | ICD-10-CM | POA: Insufficient documentation

## 2022-04-25 NOTE — Progress Notes (Signed)
Diabetes Self-Management Education  Visit Type: First/Initial  Appt. Start Time: 1515 Appt. End Time: 1620  04/25/2022  [name], identified by name and date of birth, is a 51 y.o. female with a diagnosis of Diabetes: Type 2.   ASSESSMENT  Blood pressure (!) 150/90, height '5\' 5"'$  (1.651 m), weight 195 lb 1.6 oz (88.5 kg). Body mass index is 32.47 kg/m.   Diabetes Self-Management Education - 04/25/22 1637       Visit Information   Visit Type First/Initial      Initial Visit   Diabetes Type Type 2    Date Diagnosed patient reports she didn't know she had Diabetes - she thought A1C had to be above 7 (recent A1C in Sept was 6.8)    Are you currently following a meal plan? No    Are you taking your medications as prescribed? Yes      Health Coping   How would you rate your overall health? Good      Psychosocial Assessment   Patient Belief/Attitude about Diabetes Denial   "sad, terribel, anxious, concerned, stressed"   What is the hardest part about your diabetes right now, causing you the most concern, or is the most worrisome to you about your diabetes?   Making healty food and beverage choices    Self-care barriers None    Self-management support Doctor's office    Patient Concerns Nutrition/Meal planning;Glycemic Control;Weight Control;Healthy Lifestyle    Special Needs None    Preferred Learning Style Visual;Hands on    Learning Readiness Ready    How often do you need to have someone help you when you read instructions, pamphlets, or other written materials from your doctor or pharmacy? 1 - Never    What is the last grade level you completed in school? Masters      Pre-Education Assessment   Patient understands the diabetes disease and treatment process. Needs Instruction    Patient understands incorporating nutritional management into lifestyle. Needs Instruction    Patient undertands incorporating physical activity into lifestyle. Needs Instruction    Patient  understands using medications safely. Needs Instruction    Patient understands monitoring blood glucose, interpreting and using results Needs Instruction    Patient understands prevention, detection, and treatment of acute complications. Needs Instruction    Patient understands prevention, detection, and treatment of chronic complications. Needs Instruction    Patient understands how to develop strategies to address psychosocial issues. Needs Instruction    Patient understands how to develop strategies to promote health/change behavior. Needs Instruction      Complications   Last HgB A1C per patient/outside source 6.8 %   02/15/2022   How often do you check your blood sugar? Patient declines   BG in the office was 96 mg/dL at 4:10 pm - 2 hrs after eating a [place] yogurt for snack.   Have you had a dilated eye exam in the past 12 months? Yes    Have you had a dental exam in the past 12 months? Yes    Are you checking your feet? No      Dietary Intake   Breakfast boiled egg (same every day)    Snack (morning) 1-2 snacks/day -  [place] yogurt with fruit (berries) and almonds; clementines, raisins    Lunch (same every day) cucumbers, peppers and peanut butter; sometimes fruit    Dinner chicken, pork; potatoes, peas, beans, corn, pasta, green beans, broccoli, cauliflower, greens, squash, zucchini, salad every evening  Beverage(s) water, occasional tea and Crystal light      Activity / Exercise   Activity / Exercise Type ADL's      Patient Education   Previous Diabetes Education No    Disease Pathophysiology Definition of diabetes, type 1 and 2, and the diagnosis of diabetes;Factors that contribute to the development of diabetes;Explored patient's options for treatment of their diabetes    Healthy Eating Role of diet in the treatment of diabetes and the relationship between the three main macronutrients and blood glucose level;Food label reading, portion sizes and measuring food.;Carbohydrate  counting;Reviewed blood glucose goals for pre and post meals and how to evaluate the patients' food intake on their blood glucose level.;Other (comment)   Foods to choose to lower cholesterol   Being Active Role of exercise on diabetes management, blood pressure control and cardiac health.    Medications Other (comment)   Use of medications as related to A1C results   Monitoring Taught/discussed recording of test results and interpretation of SMBG.;Interpreting lab values - A1C, lipid, urine microalbumina.;Identified appropriate SMBG and/or A1C goals.    Chronic complications Relationship between chronic complications and blood glucose control;Lipid levels, blood glucose control and heart disease    Diabetes Stress and Support Identified and addressed patients feelings and concerns about diabetes;Role of stress on diabetes      Individualized Goals (developed by patient)   Reducing Risk Other (comment)   improve blood sugars, lose weight, lead a healthier lifestyle, become more fit     Outcomes   Expected Outcomes Demonstrated interest in learning. Expect positive outcomes    Program Status Not Completed         Individualized Plan for Diabetes Self-Management Training:   Learning Objective:  Patient will have a greater understanding of diabetes self-management. Patient education plan is to attend individual and/or group sessions per assessed needs and concerns.   Plan:   Patient Instructions  Exercise:  Continue walking for    30  minutes   5  days a week  Eat 3 meals day,   1-2  snacks a day Space meals 4-6 hours apart Allow 2-3 hours between meals and snacks Include at least 1 serving of carbohydrates with each meal  Call back if you want to schedule classes or an appointment with the nurse or dietitian  Expected Outcomes:  Demonstrated interest in learning. Expect positive outcomes  Education material provided:  General Meal Planning Guidelines Simple Meal Plan Healthy  Snack Choices (ADA) Nutrition Prescription Foods to Choose to Lower Cholesterol  If problems or questions, patient to contact team via:   Brittney Drilling, RN, CCM, [place] [phone]  Future DSME appointment: PRN The patient doesn't want to return for further education at this time.

## 2022-04-25 NOTE — Patient Instructions (Signed)
Exercise:  Continue walking for    30  minutes   5  days a week  Eat 3 meals day,   1-2  snacks a day Space meals 4-6 hours apart Allow 2-3 hours between meals and snacks Include at least 1 serving of carbohydrates with each meal  Call back if you want to schedule classes or an appointment with the nurse or dietitian

## 2022-05-12 NOTE — Progress Notes (Unsigned)
Name: Brittney Higgins   MRN: [id number]    DOB: 07-21-1970   Date:05/15/2022       Progress Note  Subjective  Chief Complaint  Skin Irritation- Arms  HPI  Rash: started around Thanksgiving., no change in hygiene products or laundry products, mild dietary change - she was packing cucumbers and sweat pepper for work daily for about one month prior to symptoms, she stopped doing that. 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   Alpha thalassemia minor 02/10/2021   Polyp of sigmoid colon    Family history of colonic polyps 02/14/2019   Seborrheic dermatitis of scalp 01/08/2015   Lipoma of arm 01/08/2015   History of anemia 01/01/2015   Dyslipidemia 01/01/2015   Obesity (BMI 30.0-34.9) 01/01/2015   Vitamin D deficiency 01/01/2015   Chronic constipation 01/01/2015   Fibroid 01/01/2015   Bunion 01/01/2015    Past Surgical History:  Procedure Laterality Date   BREAST CYST ASPIRATION  age 28   COLONOSCOPY WITH PROPOFOL N/A 02/18/2019   Procedure: COLONOSCOPY WITH PROPOFOL;  Surgeon: Midge Minium, MD;  Location: [facility] ENDOSCOPY;  Service: Endoscopy;  Laterality: N/A;   DILITATION & CURRETTAGE/HYSTROSCOPY WITH NOVASURE ABLATION N/A 06/04/2013   Procedure: DILATATION & CURETTAGE/HYSTEROSCOPY WITH NOVASURE ABLATION WITH ATTEMPTED VERSAPOINT;  Surgeon: Lenoard Aden, MD;  Location: [facility] ORS;  Service: Gynecology;  Laterality: N/A;    Family History  Problem Relation Age of Onset   Hypertension Mother    COPD Mother    Colon polyps Father    Heart disease Father    CAD Father    AAA (abdominal aortic aneurysm) Father    Diabetes Father    Dementia Father    Other Father        Kidney Failure   Hypertension  Sister    Diabetes Sister    Sleep apnea Sister    Asthma Son    Diabetes Sister    Hypertension Sister    Diabetes Sister    Hypertension Sister    Breast cancer Neg Hx     Social History   Tobacco Use   Smoking status: Never    Passive exposure: Never   Smokeless tobacco: Never  Substance Use Topics   Alcohol use: No    Alcohol/week: 0.0 standard drinks of alcohol     Current Outpatient Medications:    clobetasol (OLUX) 0.05 % topical foam, Apply topically 2 (two) times daily., Disp: 50 g, Rfl: 0   Fluocinolone Acetonide Scalp 0.01 % OIL, APPLY TO SCALP DAILY AS NEEDED, Disp: 118.28 mL, Rfl: 1   ketoconazole (NIZORAL) 2 % shampoo, Once weekly, lather on scalp, leave on 5 minutes, rinse well., Disp: 120 mL, Rfl: 3   Selenium Sulfide 2.3 % SHAM, Apply 5 mLs topically 3 (three) times a week., Disp: 180 mL, Rfl: 2   Vitamin D, Ergocalciferol, (DRISDOL) 1.25 MG (50000 UNIT) CAPS capsule, TAKE 1 CAPSULE BY MOUTH ONCE A WEEK, Disp: 12 capsule, Rfl: 1  No Known Allergies  I personally reviewed active problem list, medication list, allergies, family history, social history, health maintenance with the patient/caregiver today.   ROS  Ten systems reviewed and is negative except as mentioned in HPI   Objective  Vitals:   05/15/22 0742  BP: 126/82  Pulse: 78  Resp: 16  SpO2: 99%  Weight: 190 lb (86.2 kg)  Height: 5\' 5"  (1.651 m)    Body mass index is 31.62 kg/m.  Physical Exam  Constitutional: Patient appears well-developed and well-nourished. Obese  No distress.  HEENT: head atraumatic, normocephalic, pupils equal and reactive to light, neck supple Cardiovascular: Normal rate, regular rhythm and normal heart sounds.  No murmur heard. No BLE edema. Pulmonary/Chest: Effort normal and breath sounds normal. No respiratory distress. Abdominal: Soft.  There is no tenderness. Skin: dry arms , with some hyperpigmentation , feels like keratosis pilaris  Psychiatric: Patient  has a normal mood and affect. behavior is normal. Judgment and thought content normal.   Recent Results (from the past 2160 hour(s))  Lipid panel     Status: Abnormal   Collection Time: 02/15/22  9:44 AM  Result Value Ref Range   Cholesterol 208 (H) <200 mg/dL   HDL 37 (L) > OR = 50 mg/dL   Triglycerides 119 (H) <150 mg/dL   LDL Cholesterol (Calc) 137 (H) mg/dL (calc)    Comment: Reference range: <100 . Desirable range <100 mg/dL for primary prevention;   <70 mg/dL for patients with CHD or diabetic patients  with > or = 2 CHD risk factors. Marland Kitchen LDL-C is now calculated using the Martin-Hopkins  calculation, which is a validated novel method providing  better accuracy than the Friedewald equation in the  estimation of LDL-C.  Horald Pollen et al. Lenox Ahr. 1478;295(62): 2061-2068  (http://education.[facility].com/faq/FAQ164)    Total CHOL/HDL Ratio 5.6 (H) <5.0 (calc)   Non-HDL Cholesterol (Calc) 171 (H) <130 mg/dL (calc)    Comment: For patients with diabetes plus 1 major ASCVD risk  factor, treating to a non-HDL-C goal of <100 mg/dL  (LDL-C of <13 mg/dL) is considered a therapeutic  option.   COMPLETE METABOLIC PANEL WITH GFR     Status: Abnormal   Collection Time: 02/15/22  9:44 AM  Result Value Ref Range   Glucose, Bld 135 (H) 65 - 99 mg/dL    Comment: .            Fasting reference interval . For someone without known diabetes, a glucose value >125 mg/dL indicates that they may have diabetes and this should be confirmed with a follow-up test. .    BUN 13 7 - 25 mg/dL   Creat 0.86 5.78 - 4.69 mg/dL   eGFR 85 > OR = 60 GE/XBM/8.41L2   BUN/Creatinine Ratio SEE NOTE: 6 - 22 (calc)    Comment:    Not Reported: BUN and Creatinine are within    reference range. .    Sodium 142 135 - 146 mmol/L   Potassium 4.9 3.5 - 5.3 mmol/L   Chloride 103 98 - 110 mmol/L   CO2 28 20 - 32 mmol/L   Calcium 9.2 8.6 - 10.4 mg/dL   Total Protein 7.8 6.1 - 8.1 g/dL   Albumin 4.5 3.6 - 5.1  g/dL   Globulin 3.3 1.9 - 3.7 g/dL (calc)   AG Ratio 1.4 1.0 - 2.5 (calc)   Total Bilirubin 0.7 0.2 - 1.2 mg/dL   Alkaline phosphatase (APISO) 46 37 - 153 U/L   AST 21 10 - 35 U/L   ALT 23 6 - 29 U/L  CBC with Differential/Platelet     Status: Abnormal   Collection Time: 02/15/22  9:44 AM  Result Value Ref Range   WBC 7.5 3.8 -  10.8 Thousand/uL   RBC 5.90 (H) 3.80 - 5.10 Million/uL   Hemoglobin 13.3 11.7 - 15.5 g/dL   HCT 16.1 09.6 - 04.5 %   MCV 72.5 (L) 80.0 - 100.0 fL   MCH 22.5 (L) 27.0 - 33.0 pg   MCHC 31.1 (L) 32.0 - 36.0 g/dL   RDW 40.9 81.1 - 91.4 %   Platelets 305 140 - 400 Thousand/uL   MPV 11.8 7.5 - 12.5 fL   Neutro Abs 3,750 1,500 - 7,800 cells/uL   Lymphs Abs 2,843 850 - 3,900 cells/uL   Absolute Monocytes 608 200 - 950 cells/uL   Eosinophils Absolute 233 15 - 500 cells/uL   Basophils Absolute 68 0 - 200 cells/uL   Neutrophils Relative % 50 %   Total Lymphocyte 37.9 %   Monocytes Relative 8.1 %   Eosinophils Relative 3.1 %   Basophils Relative 0.9 %  Hemoglobin A1c     Status: Abnormal   Collection Time: 02/15/22  9:44 AM  Result Value Ref Range   Hgb A1c MFr Bld 6.8 (H) <5.7 % of total Hgb    Comment: For someone without known diabetes, a hemoglobin A1c value of 6.5% or greater indicates that they may have  diabetes and this should be confirmed with a follow-up  test. . For someone with known diabetes, a value <7% indicates  that their diabetes is well controlled and a value  greater than or equal to 7% indicates suboptimal  control. A1c targets should be individualized based on  duration of diabetes, age, comorbid conditions, and  other considerations. . Currently, no consensus exists regarding use of hemoglobin A1c for diagnosis of diabetes for children. .    Mean Plasma Glucose 148 mg/dL   eAG (mmol/L) 8.2 mmol/L  VITAMIN D 25 Hydroxy (Vit-D Deficiency, Fractures)     Status: None   Collection Time: 02/15/22  9:44 AM  Result Value Ref Range    Vit D, 25-Hydroxy 66 30 - 100 ng/mL    Comment: Vitamin D Status         25-OH Vitamin D: . Deficiency:                    <20 ng/mL Insufficiency:             20 - 29 ng/mL Optimal:                 > or = 30 ng/mL . For 25-OH Vitamin D testing on patients on  D2-supplementation and patients for whom quantitation  of D2 and D3 fractions is required, the [facility]ureD(TM) 25-OH VIT D, (D2,D3), LC/MS/MS is recommended: order  code 78295 (patients >38yrs). . See Note 1 . Note 1 . For additional information, please refer to  http://education.[facility].com/faq/FAQ199  (This link is being provided for informational/ educational purposes only.)   Urine Microalbumin w/creat. ratio     Status: Abnormal   Collection Time: 02/22/22  4:26 PM  Result Value Ref Range   Creatinine, Urine 179 20 - 275 mg/dL   Microalb, Ur 7.7 mg/dL    Comment: Reference Range Not established    Microalb Creat Ratio 43 (H) <30 mcg/mg creat    Comment: . The ADA defines abnormalities in albumin excretion as follows: Marland Kitchen Albuminuria Category        Result (mcg/mg creatinine) . Normal to Mildly increased   <30 Moderately increased         30-299  Severely increased           >  OR = 300 . The ADA recommends that at least two of three specimens collected within a 3-6 month period be abnormal before considering a patient to be within a diagnostic category.       PHQ2/9:    05/15/2022    7:42 AM 04/25/2022    3:26 PM 02/22/2022    3:28 PM 02/15/2022    9:02 AM 08/10/2021    3:47 PM  Depression screen PHQ 2/9  Decreased Interest 0 0 0 0 0  Down, Depressed, Hopeless 0 0 0 0 0  PHQ - 2 Score 0 0 0 0 0  Altered sleeping 0  0 0 0  Tired, decreased energy 0  1 1 0  Change in appetite 0  0 0 0  Feeling bad or failure about yourself  0  0 0 0  Trouble concentrating 0  0 0 0  Moving slowly or fidgety/restless 0  0 0 0  Suicidal thoughts 0  0 0 0  PHQ-9 Score 0  1 1 0    phq 9 is  negative   Fall Risk:    05/15/2022    7:42 AM 04/25/2022    3:26 PM 02/22/2022    3:28 PM 02/15/2022    9:01 AM 08/10/2021    3:47 PM  Fall Risk   Falls in the past year? 0 0 0 0 0  Number falls in past yr: 0  0 0 0  Injury with Fall? 0  0 0 0  Risk for fall due to : No Fall Risks  No Fall Risks No Fall Risks No Fall Risks  Follow up Falls prevention discussed  Falls prevention discussed Falls prevention discussed Falls prevention discussed      Functional Status Survey: Is the patient deaf or have difficulty hearing?: No Does the patient have difficulty seeing, even when wearing glasses/contacts?: No Does the patient have difficulty concentrating, remembering, or making decisions?: No Does the patient have difficulty walking or climbing stairs?: No Does the patient have difficulty dressing or bathing?: No Does the patient have difficulty doing errands alone such as visiting a doctor's office or shopping?: No    Assessment & Plan  1. Dermatitis  Possible eczema  - triamcinolone cream (KENALOG) 0.1 %; Apply 1 Application topically 2 (two) times daily.  Dispense: 453.6 g; Refill: 0

## 2022-05-15 ENCOUNTER — Ambulatory Visit (INDEPENDENT_AMBULATORY_CARE_PROVIDER_SITE_OTHER): Payer: BC Managed Care – PPO | Admitting: Family Medicine

## 2022-05-15 ENCOUNTER — Encounter: Payer: Self-pay | Admitting: Family Medicine

## 2022-05-15 VITALS — BP 126/82 | HR 78 | Resp 16 | Ht 65.0 in | Wt 190.0 lb

## 2022-05-15 DIAGNOSIS — L309 Dermatitis, unspecified: Secondary | ICD-10-CM | POA: Diagnosis not present

## 2022-05-15 MED ORDER — TRIAMCINOLONE ACETONIDE 0.1 % EX CREA: 1.0000 | TOPICAL_CREAM | Freq: Two times a day (BID) | CUTANEOUS | 0 refills | Status: AC

## 2022-05-18 ENCOUNTER — Ambulatory Visit
Admission: RE | Admit: 2022-05-18 | Discharge: 2022-05-18 | Disposition: A | Discharge status: Home / Self Care | Payer: BC Managed Care – PPO | Source: Ambulatory Visit | Attending: Family Medicine | Admitting: Family Medicine

## 2022-05-18 DIAGNOSIS — Z1231 Encounter for screening mammogram for malignant neoplasm of breast: Secondary | ICD-10-CM | POA: Diagnosis present

## 2022-06-23 ENCOUNTER — Ambulatory Visit: Payer: BC Managed Care – PPO | Admitting: Family Medicine

## 2022-08-16 ENCOUNTER — Ambulatory Visit: Payer: BC Managed Care – PPO | Admitting: Family Medicine

## 2022-10-22 ENCOUNTER — Encounter: Payer: Self-pay | Admitting: Family Medicine

## 2022-10-24 ENCOUNTER — Ambulatory Visit: Payer: BC Managed Care – PPO | Admitting: Physician Assistant

## 2022-10-24 ENCOUNTER — Encounter: Payer: Self-pay | Admitting: Physician Assistant

## 2022-10-24 VITALS — BP 132/80 | HR 91 | Resp 16 | Ht 65.0 in | Wt 193.0 lb

## 2022-10-24 DIAGNOSIS — E785 Hyperlipidemia, unspecified: Secondary | ICD-10-CM | POA: Diagnosis not present

## 2022-10-24 DIAGNOSIS — E119 Type 2 diabetes mellitus without complications: Secondary | ICD-10-CM | POA: Diagnosis not present

## 2022-10-24 DIAGNOSIS — E1169 Type 2 diabetes mellitus with other specified complication: Secondary | ICD-10-CM

## 2022-10-24 LAB — POCT GLYCOSYLATED HEMOGLOBIN (HGB A1C): Hemoglobin A1C: 6.9 % — AB (ref 4.0–5.6)

## 2022-10-24 MED ORDER — CLOBETASOL PROPIONATE 0.05 % EX FOAM: Freq: Two times a day (BID) | CUTANEOUS | 0 refills | Status: DC

## 2022-10-24 NOTE — Progress Notes (Signed)
Established Patient Apt    Patient: Brittney Higgins   DOB: [birth year]   52 y.o. Female  MRN: [id number] Visit Date: 10/24/2022  Today's healthcare provider: Oswaldo Conroy Illyanna Petillo, PA-C  Introduced myself to the patient as a Secondary school teacher and provided education on APPs in clinical practice.    Chief Complaint  Patient presents with   Medication Refill   Subjective    HPI    Diabetes, Type 2 A1c today: 6.9  - Medications: none  - Compliance: NA - Checking BG at home: not checking at home  - Diet: overall normal dietary intake. She has been to a dietician and they told her to continue with her vegetables and to increase her carbohydrates. She states she avoids sugary drinks and tries to drink plenty of water.  - Exercise: She has not exercised since April  - Eye exam: Discussed importance of regular eye exams for screening - Foot exam: performed today  - Microalbumin: UTD - Statin: none - PNA vaccine: declined  - Denies symptoms of hypoglycemia, polyuria, polydipsia, numbness extremities, foot ulcers/trauma   She reports some hot flashes throughout the day along with increased irritability since Aug 2023 She reports some lack of motivation in the last few weeks  Her last menstrual period was several years ago She had an ablation procedure about 10 years ago and has not had period since then     Medications: Outpatient Medications Prior to Visit  Medication Sig   clobetasol (OLUX) 0.05 % topical foam Apply topically 2 (two) times daily.   Fluocinolone Acetonide Scalp 0.01 % OIL APPLY TO SCALP DAILY AS NEEDED   ketoconazole (NIZORAL) 2 % shampoo Once weekly, lather on scalp, leave on 5 minutes, rinse well.   Selenium Sulfide 2.3 % SHAM Apply 5 mLs topically 3 (three) times a week.   triamcinolone cream (KENALOG) 0.1 % Apply 1 Application topically 2 (two) times daily.   Vitamin D, Ergocalciferol, (DRISDOL) 1.25 MG (50000 UNIT) CAPS capsule TAKE 1 CAPSULE BY MOUTH ONCE A WEEK   No  facility-administered medications prior to visit.    Review of Systems  Constitutional:  Positive for diaphoresis.  Respiratory:  Negative for shortness of breath and wheezing.   Endocrine: Negative for polydipsia, polyphagia and polyuria.  Psychiatric/Behavioral:  Positive for agitation.        Increased irritability and lack of motivation         Objective    BP 132/80   Pulse 91   Resp 16   Ht 5\' 5"  (1.651 m)   Wt 193 lb (87.5 kg)   SpO2 98%   BMI 32.12 kg/m    Physical Exam Vitals reviewed.  Constitutional:      General: She is awake.     Appearance: Normal appearance. She is well-developed and well-groomed.  HENT:     Head: Normocephalic and atraumatic.  Cardiovascular:     Rate and Rhythm: Normal rate and regular rhythm.     Pulses:          Dorsalis pedis pulses are 2+ on the right side and 2+ on the left side.       Posterior tibial pulses are 2+ on the right side and 2+ on the left side.     Heart sounds: Normal heart sounds. No murmur heard.    No friction rub. No gallop.  Pulmonary:     Effort: Pulmonary effort is normal.  Musculoskeletal:  Cervical back: Normal range of motion and neck supple.     Right lower leg: No edema.     Left lower leg: No edema.  Skin:    General: Skin is warm and dry.  Neurological:     General: No focal deficit present.     Mental Status: She is alert and oriented to person, place, and time. Mental status is at baseline.     GCS: GCS eye subscore is 4. GCS verbal subscore is 5. GCS motor subscore is 6.  Psychiatric:        Behavior: Behavior is cooperative.       Results for orders placed or performed in visit on 10/24/22  POCT HgB A1C  Result Value Ref Range   Hemoglobin A1C 6.9 (A) 4.0 - 5.6 %   HbA1c POC (<> result, manual entry)     HbA1c, POC (prediabetic range)     HbA1c, POC (controlled diabetic range)      Assessment & Plan      Return in about 6 weeks (around 12/05/2022) for hot flashes and  irritability .      Problem List Items Addressed This Visit       Endocrine   Dyslipidemia associated with type 2 diabetes mellitus (HCC)    Chronic, historic condition A1c today 6.9  She reports desire to manage with diet and exercise over medications  We discussed her diet and I recommended incorporating whole grains and complex carbs, increased protein to her diet I also recommend exercise to help manage condition and prevent progression Recommend lipid panel at her follow-up visit in about 3 months for monitoring       Relevant Orders   HM Diabetes Foot Exam (Completed)   POCT HgB A1C (Completed)   Type 2 diabetes mellitus without complication, without long-term current use of insulin (HCC) - Primary    Chronic, historic condition A1c today is 6.9 She reports continued desire to manage condition with diet and exercise rather than medications We discussed dietary recommendations along with exercise appropriate for her age Recommend that she engages with these recommendations to provide appropriate lifestyle management for her diabetes Recommend regular follow-up in 3 months for monitoring         Return in about 6 weeks (around 12/05/2022) for hot flashes and irritability .   I, Johnryan Sao E Alynn Ellithorpe, PA-C, have reviewed all documentation for this visit. The documentation on 10/30/22 for the exam, diagnosis, procedures, and orders are all accurate and complete.   Jacquelin Hawking, MHS, PA-C [facility]

## 2022-10-24 NOTE — Assessment & Plan Note (Addendum)
Chronic, historic condition A1c today 6.9  She reports desire to manage with diet and exercise over medications  We discussed her diet and I recommended incorporating whole grains and complex carbs, increased protein to her diet I also recommend exercise to help manage condition and prevent progression Recommend lipid panel at her follow-up visit in about 3 months for monitoring

## 2022-10-25 ENCOUNTER — Encounter: Payer: Self-pay | Admitting: Physician Assistant

## 2022-10-27 NOTE — Progress Notes (Signed)
Results reviewed during apt with patient- discussed lifestyle changes to assist with management through shared decision making

## 2022-10-30 DIAGNOSIS — E119 Type 2 diabetes mellitus without complications: Secondary | ICD-10-CM | POA: Insufficient documentation

## 2022-10-30 DIAGNOSIS — E1129 Type 2 diabetes mellitus with other diabetic kidney complication: Secondary | ICD-10-CM | POA: Insufficient documentation

## 2022-10-30 NOTE — Assessment & Plan Note (Signed)
Chronic, historic condition A1c today is 6.9 She reports continued desire to manage condition with diet and exercise rather than medications We discussed dietary recommendations along with exercise appropriate for her age Recommend that she engages with these recommendations to provide appropriate lifestyle management for her diabetes Recommend regular follow-up in 3 months for monitoring

## 2022-10-30 NOTE — Telephone Encounter (Signed)
Some of the symptoms can be associated with going through menopause, such as lack of motivation and trouble focusing.  The groin pain might be something you would  need to schedule an appointment with [place] to discuss and you could likely discuss therapies for menopause during that follow up apt as well.I typically don't recommend herbal supplements for management as they do not have proven efficacy over placebo. Sometimes starting a low dose SSRI can provide relief but this would need an apt to discuss further.

## 2022-12-22 NOTE — Progress Notes (Unsigned)
Name: Brittney Higgins   MRN: [id number]    DOB: [birth year]   Date:12/25/2022       Progress Note  Subjective  Chief Complaint  Follow Up  HPI  Dyslipidemia: reviewed labs with patient, discussed risk below and the fact that now she has DM, but at this time she does not want to start medication.   The 10-year ASCVD risk score (Arnett DK, et al., 2019) is: 9.9%   Values used to calculate the score:     Age: 52 years     Sex: Female     Is Non-Hispanic African American: Yes     Diabetic: Yes     Tobacco smoker: No     Systolic Blood Pressure: 130 mmHg     Is BP treated: No     HDL Cholesterol: 37 mg/dL     Total Cholesterol: 208 mg/dL    Diabetes type II: W0J has been below 7 %, last time it was 6.9 % in March, she went to dietician and was told to increase carbohydrate in her diet due to low calorie intake, advised her to increase protein intake and only eat complex carbohydrates.  She has a history of dyslipidemia and HDL is low at 37, LDL is elevated 137.  She refuses statin therapy   Hot Flashes: she had multiple episodes a day while at work , she has not had any episodes during weekends or Summer break. She thinks it was stress related, she is going to start at [employer] [employer]. She is  retiring due to not wanting to switch to HS at this time.   Chronic constipation: she has bowel movements about once a week, no pain or discomfort, symptoms are chronic Unchanged    Alpha Thalassemia: seen by Dr. Cathie Hoops, chronic microcytosis. Unchanged    Seborrhea capitis: she has seen Dr. Roseanne Reno, she is doing better, she still has a bald spot but the flakiness has improved, using clobetasol foam and fluocinonide oil also nizoral shampoo. She does not want to go back to see her at this time , using a hair accessory to cover the area   Vitamin D deficiency: taking rx vitamin D weekly .  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   Alpha thalassemia minor 02/10/2021   Polyp of sigmoid colon    Family history of colonic polyps 02/14/2019   Seborrheic dermatitis of scalp 01/08/2015   Lipoma of arm 01/08/2015   History of anemia 01/01/2015   Dyslipidemia 01/01/2015   Obesity (BMI 30.0-34.9) 01/01/2015   Vitamin D deficiency 01/01/2015   Chronic constipation 01/01/2015   Fibroid 01/01/2015   Bunion 01/01/2015    Past Surgical History:  Procedure Laterality Date   BREAST CYST ASPIRATION  age 1   COLONOSCOPY WITH PROPOFOL N/A 02/18/2019   Procedure: COLONOSCOPY WITH PROPOFOL;  Surgeon: Midge Minium, MD;  Location: [facility];  Service: Endoscopy;  Laterality: N/A;   DILITATION & CURRETTAGE/HYSTROSCOPY WITH NOVASURE ABLATION N/A 06/04/2013   Procedure: DILATATION & CURETTAGE/HYSTEROSCOPY WITH NOVASURE ABLATION WITH ATTEMPTED VERSAPOINT;  Surgeon: Lenoard Aden, MD;  Location: [facility];  Service: Gynecology;  Laterality: N/A;    Family History  Problem Relation Age of Onset   Hypertension Mother    COPD  Mother    Colon polyps Father    Heart disease Father    CAD Father    AAA (abdominal aortic aneurysm) Father    Diabetes Father    Dementia Father    Other Father        Kidney Failure   Hypertension Sister    Diabetes Sister    Sleep apnea Sister    Asthma Son    Diabetes Sister    Hypertension Sister    Diabetes Sister    Hypertension Sister    Breast cancer Neg Hx     Social History   Tobacco Use   Smoking status: Never    Passive exposure: Never   Smokeless tobacco: Never  Substance Use Topics   Alcohol use: No    Alcohol/week: 0.0 standard drinks of alcohol     Current Outpatient Medications:    clobetasol (OLUX) 0.05 % topical foam, Apply topically 2 (two) times daily., Disp: 50 g, Rfl:  0   Fluocinolone Acetonide Scalp 0.01 % OIL, APPLY TO SCALP DAILY AS NEEDED, Disp: 118.28 mL, Rfl: 1   ketoconazole (NIZORAL) 2 % shampoo, Once weekly, lather on scalp, leave on 5 minutes, rinse well., Disp: 120 mL, Rfl: 3   Selenium Sulfide 2.3 % SHAM, Apply 5 mLs topically 3 (three) times a week., Disp: 180 mL, Rfl: 2   triamcinolone cream (KENALOG) 0.1 %, Apply 1 Application topically 2 (two) times daily., Disp: 453.6 g, Rfl: 0   Vitamin D, Ergocalciferol, (DRISDOL) 1.25 MG (50000 UNIT) CAPS capsule, TAKE 1 CAPSULE BY MOUTH ONCE A WEEK, Disp: 12 capsule, Rfl: 1  No Known Allergies  I personally reviewed active problem list, medication list, allergies, family history, social history, health maintenance with the patient/caregiver today.   ROS  Constitutional: Negative for fever or weight change.  Respiratory: Negative for cough and shortness of breath.   Cardiovascular: Negative for chest pain or palpitations.  Gastrointestinal: Negative for abdominal pain, no bowel changes.  Musculoskeletal: Negative for gait problem or joint swelling.  Skin: Negative for rash.  Neurological: Negative for dizziness or headache.  No other specific complaints in a complete review of systems (except as listed in HPI above).   Objective  Vitals:   12/25/22 0911  BP: 130/78  Pulse: 85  Resp: 16  SpO2: 99%  Weight: 195 lb (88.5 kg)  Height: 5\' 4"  (1.626 m)    Body mass index is 33.47 kg/m.  Physical Exam  Constitutional: Patient appears well-developed and well-nourished. Obese  No distress.  HEENT: head atraumatic, normocephalic, pupils equal and reactive to light, neck supple, throat within normal limits Cardiovascular: Normal rate, regular rhythm and normal heart sounds.  No murmur heard. No BLE edema. Pulmonary/Chest: Effort normal and breath sounds normal. No respiratory distress. Abdominal: Soft.  There is no tenderness. Psychiatric: Patient has a normal mood and affect. behavior is  normal. Judgment and thought content normal.   Recent Results (from the past 2160 hour(s))  POCT HgB A1C     Status: Abnormal   Collection Time: 10/24/22  3:55 PM  Result Value Ref Range   Hemoglobin A1C 6.9 (A) 4.0 - 5.6 %   HbA1c POC (<> result, manual entry)     HbA1c, POC (prediabetic range)     HbA1c, POC (controlled diabetic range)      PHQ2/9:    12/25/2022    9:11 AM 10/24/2022    3:45 PM 05/15/2022    7:42 AM 04/25/2022    3:26 PM  02/22/2022    3:28 PM  Depression screen PHQ 2/9  Decreased Interest 0 0 0 0 0  Down, Depressed, Hopeless 2 1 0 0 0  PHQ - 2 Score 2 1 0 0 0  Altered sleeping 0 0 0  0  Tired, decreased energy 0 1 0  1  Change in appetite 0 0 0  0  Feeling bad or failure about yourself  0 1 0  0  Trouble concentrating 0 1 0  0  Moving slowly or fidgety/restless 0 0 0  0  Suicidal thoughts 0 0 0  0  PHQ-9 Score 2 4 0  1    phq 9 is positive   Fall Risk:    12/25/2022    9:11 AM 10/24/2022    3:45 PM 05/15/2022    7:42 AM 04/25/2022    3:26 PM 02/22/2022    3:28 PM  Fall Risk   Falls in the past year? 0 0 0 0 0  Number falls in past yr: 0 0 0  0  Injury with Fall? 0 0 0  0  Risk for fall due to : No Fall Risks No Fall Risks No Fall Risks  No Fall Risks  Follow up Falls prevention discussed Falls prevention discussed Falls prevention discussed  Falls prevention discussed      Functional Status Survey: Is the patient deaf or have difficulty hearing?: No Does the patient have difficulty seeing, even when wearing glasses/contacts?: No Does the patient have difficulty concentrating, remembering, or making decisions?: No Does the patient have difficulty walking or climbing stairs?: No Does the patient have difficulty dressing or bathing?: No Does the patient have difficulty doing errands alone such as visiting a doctor's office or shopping?: No    Assessment & Plan  1. Dyslipidemia associated with type 2 diabetes mellitus (HCC)  Discussed SGL-2  agonist, GLP-1 agonist  and Metformin, she will research and let me know what she prefers , in case A1C goes above 7 %  Denies personal history of pancreatitis or family history of thyroid cancer  2. Alpha thalassemia minor  Unchanged  3. Vitamin D deficiency  Continue supplementation  4.Dyslipidemia  Refuses stating therapy  5. Seborrheic dermatitis of scalp   stable

## 2022-12-25 ENCOUNTER — Encounter: Payer: Self-pay | Admitting: Family Medicine

## 2022-12-25 ENCOUNTER — Ambulatory Visit: Payer: BC Managed Care – PPO | Admitting: Family Medicine

## 2022-12-25 VITALS — BP 130/78 | HR 85 | Resp 16 | Ht 64.0 in | Wt 195.0 lb

## 2022-12-25 DIAGNOSIS — E559 Vitamin D deficiency, unspecified: Secondary | ICD-10-CM

## 2022-12-25 DIAGNOSIS — D563 Thalassemia minor: Secondary | ICD-10-CM | POA: Diagnosis not present

## 2022-12-25 DIAGNOSIS — L219 Seborrheic dermatitis, unspecified: Secondary | ICD-10-CM

## 2022-12-25 DIAGNOSIS — E1169 Type 2 diabetes mellitus with other specified complication: Secondary | ICD-10-CM | POA: Diagnosis not present

## 2022-12-25 DIAGNOSIS — E785 Hyperlipidemia, unspecified: Secondary | ICD-10-CM | POA: Diagnosis not present

## 2022-12-25 MED ORDER — VITAMIN D (ERGOCALCIFEROL) 1.25 MG (50000 UNIT) PO CAPS: 50000.0000 [IU] | ORAL_CAPSULE | ORAL | 1 refills | Status: DC

## 2022-12-25 NOTE — Patient Instructions (Signed)
Research: Oral medications: Metformin, SGL-2 agonist, Rybelsus Injectables/weekly: Ozempic or Mounjaro

## 2023-02-21 ENCOUNTER — Encounter: Payer: BC Managed Care – PPO | Admitting: Family Medicine

## 2023-03-11 ENCOUNTER — Encounter: Payer: Self-pay | Admitting: Family Medicine

## 2023-03-12 ENCOUNTER — Encounter: Payer: Self-pay | Admitting: Family Medicine

## 2023-03-12 ENCOUNTER — Ambulatory Visit (INDEPENDENT_AMBULATORY_CARE_PROVIDER_SITE_OTHER): Payer: BC Managed Care – PPO | Admitting: Family Medicine

## 2023-03-12 VITALS — BP 124/72 | HR 72 | Resp 16 | Ht 63.0 in | Wt 181.0 lb

## 2023-03-12 DIAGNOSIS — Z23 Encounter for immunization: Secondary | ICD-10-CM

## 2023-03-12 DIAGNOSIS — E559 Vitamin D deficiency, unspecified: Secondary | ICD-10-CM

## 2023-03-12 DIAGNOSIS — Z0000 Encounter for general adult medical examination without abnormal findings: Secondary | ICD-10-CM

## 2023-03-12 DIAGNOSIS — R809 Proteinuria, unspecified: Secondary | ICD-10-CM

## 2023-03-12 DIAGNOSIS — E1169 Type 2 diabetes mellitus with other specified complication: Secondary | ICD-10-CM

## 2023-03-12 DIAGNOSIS — Z0001 Encounter for general adult medical examination with abnormal findings: Secondary | ICD-10-CM | POA: Diagnosis not present

## 2023-03-12 DIAGNOSIS — Z1231 Encounter for screening mammogram for malignant neoplasm of breast: Secondary | ICD-10-CM

## 2023-03-12 DIAGNOSIS — D563 Thalassemia minor: Secondary | ICD-10-CM

## 2023-03-12 DIAGNOSIS — E1129 Type 2 diabetes mellitus with other diabetic kidney complication: Secondary | ICD-10-CM

## 2023-03-12 DIAGNOSIS — E785 Hyperlipidemia, unspecified: Secondary | ICD-10-CM

## 2023-03-12 NOTE — Progress Notes (Signed)
Name: Brittney Higgins   MRN: [id number]    DOB: Dec 23, 1970   Date:03/12/2023       Progress Note  Subjective  Chief Complaint  Annual Exam  HPI  Patient presents for annual CPE.  Diet: eating better since she retired, cooking at home  Exercise: continue regular physical activity   Last Eye Exam: up to date Last Dental Exam: up to date  [employer] Visit from 03/12/2023 in [facility]  AUDIT-C Score 0      Depression: Phq 9 is  negative    03/12/2023    1:23 PM 12/25/2022    9:11 AM 10/24/2022    3:45 PM 05/15/2022    7:42 AM 04/25/2022    3:26 PM  Depression screen PHQ 2/9  Decreased Interest 0 0 0 0 0  Down, Depressed, Hopeless 0 2 1 0 0  PHQ - 2 Score 0 2 1 0 0  Altered sleeping 0 0 0 0   Tired, decreased energy 0 0 1 0   Change in appetite 0 0 0 0   Feeling bad or failure about yourself  0 0 1 0   Trouble concentrating 0 0 1 0   Moving slowly or fidgety/restless 0 0 0 0   Suicidal thoughts 0 0 0 0   PHQ-9 Score 0 2 4 0    Hypertension: BP Readings from Last 3 Encounters:  03/12/23 124/72  12/25/22 130/78  10/24/22 132/80   Obesity: Wt Readings from Last 3 Encounters:  03/12/23 181 lb (82.1 kg)  12/25/22 195 lb (88.5 kg)  10/24/22 193 lb (87.5 kg)   BMI Readings from Last 3 Encounters:  03/12/23 32.06 kg/m  12/25/22 33.47 kg/m  10/24/22 32.12 kg/m     Vaccines:    Tdap: up to date Shingrix: refused Pneumonia: refused  Flu: today  COVID-19: up to date   Hep C Screening: 02/04/19 STD testing and prevention (HIV/chl/gon/syphilis): 12/16/98 Intimate partner violence: negative screen  Sexual History : married one partner  Menstrual History/LMP/Abnormal Bleeding: post-menopausal - s/p endometrial ablation Discussed importance of follow up if any post-menopausal bleeding: N/A  Incontinence Symptoms: negative for symptoms   Breast cancer:  - Last Mammogram: 05/18/22 - BRCA gene screening: N/A  Osteoporosis  Prevention : Discussed high calcium and vitamin D supplementation, weight bearing exercises Bone density: N/A  Cervical cancer screening: 02/10/21  Skin cancer: Discussed monitoring for atypical lesions  Colorectal cancer: 02/18/19   Lung cancer:  Low Dose CT Chest recommended if Age 75-80 years, 20 pack-year currently smoking OR have quit w/in 15years. Patient does not qualify for screen   ECG: N/A  Advanced Care Planning: A voluntary discussion about advance care planning including the explanation and discussion of advance directives.  Discussed health care proxy and Living will, and the patient was able to identify a health care proxy as husband .  Patient does not have a living will and power of attorney of health care   Lipids: Lab Results  Component Value Date   CHOL 208 (H) 02/15/2022   CHOL 197 02/10/2021   CHOL 200 (H) 02/05/2020   Lab Results  Component Value Date   HDL 37 (L) 02/15/2022   HDL 39 (L) 02/10/2021   HDL 43 (L) 02/05/2020   Lab Results  Component Value Date   LDLCALC 137 (H) 02/15/2022   LDLCALC 128 (H) 02/10/2021   LDLCALC 131 (H) 02/05/2020   Lab Results  Component Value Date  TRIG 197 (H) 02/15/2022   TRIG 179 (H) 02/10/2021   TRIG 147 02/05/2020   Lab Results  Component Value Date   CHOLHDL 5.6 (H) 02/15/2022   CHOLHDL 5.1 (H) 02/10/2021   CHOLHDL 4.7 02/05/2020   No results found for: "LDLDIRECT"  Glucose: Glucose, Bld  Date Value Ref Range Status  02/15/2022 135 (H) 65 - 99 mg/dL Final    Comment:    .            Fasting reference interval . For someone without known diabetes, a glucose value >125 mg/dL indicates that they may have diabetes and this should be confirmed with a follow-up test. .   02/10/2021 107 (H) 65 - 99 mg/dL Final    Comment:    .            Fasting reference interval . For someone without known diabetes, a glucose value between 100 and 125 mg/dL is consistent with prediabetes and should be confirmed  with a follow-up test. .   02/05/2020 120 (H) 65 - 99 mg/dL Final    Comment:    .            Fasting reference interval . For someone without known diabetes, a glucose value between 100 and 125 mg/dL is consistent with prediabetes and should be confirmed with a follow-up test. .     Patient Active Problem List   Diagnosis Date Noted   Type 2 diabetes mellitus without complication, without long-term current use of insulin (HCC) 10/30/2022   Dyslipidemia associated with type 2 diabetes mellitus (HCC) 02/22/2022   Central centrifugal scarring alopecia 02/22/2022   Alpha thalassemia minor 02/10/2021   Polyp of sigmoid colon    Family history of colonic polyps 02/14/2019   Seborrheic dermatitis of scalp 01/08/2015   Lipoma of arm 01/08/2015   History of anemia 01/01/2015   Dyslipidemia 01/01/2015   Obesity (BMI 30.0-34.9) 01/01/2015   Vitamin D deficiency 01/01/2015   Chronic constipation 01/01/2015   Fibroid 01/01/2015   Bunion 01/01/2015    Past Surgical History:  Procedure Laterality Date   BREAST CYST ASPIRATION  age 51   COLONOSCOPY WITH PROPOFOL N/A 02/18/2019   Procedure: COLONOSCOPY WITH PROPOFOL;  Surgeon: Midge Minium, MD;  Location: [facility] ENDOSCOPY;  Service: Endoscopy;  Laterality: N/A;   DILITATION & CURRETTAGE/HYSTROSCOPY WITH NOVASURE ABLATION N/A 06/04/2013   Procedure: DILATATION & CURETTAGE/HYSTEROSCOPY WITH NOVASURE ABLATION WITH ATTEMPTED VERSAPOINT;  Surgeon: Lenoard Aden, MD;  Location: [facility];  Service: Gynecology;  Laterality: N/A;    Family History  Problem Relation Age of Onset   Hypertension Mother    COPD Mother    Colon polyps Father    Heart disease Father    CAD Father    AAA (abdominal aortic aneurysm) Father    Diabetes Father    Dementia Father    Other Father        Kidney Failure   Hypertension Sister    Diabetes Sister    Sleep apnea Sister    Asthma Son    Diabetes Sister    Hypertension Sister    Diabetes Sister     Hypertension Sister    Breast cancer Neg Hx     Social History   Socioeconomic History   Marital status: Married    Spouse name: Onalee Hua   Number of children: 1   Years of education: Not on file   Highest education level: Master's degree (e.g., MA, MS, MEng, MEd, MSW, MBA)  Occupational History   Occupation: assistance principal     Employer: [employer]    Comment: [employer]  Tobacco Use   Smoking status: Never    Passive exposure: Never   Smokeless tobacco: Never  Vaping Use   Vaping status: Never Used  Substance and Sexual Activity   Alcohol use: No    Alcohol/week: 0.0 standard drinks of alcohol   Drug use: No   Sexual activity: Yes    Partners: Male    Birth control/protection: None  Other Topics Concern   Not on file  Social History Narrative   Not on file   Social Determinants of Health   Financial Resource Strain: Low Risk  (03/12/2023)   Overall Financial Resource Strain (CARDIA)    Difficulty of Paying Living Expenses: Not hard at all  Food Insecurity: No Food Insecurity (03/12/2023)   Hunger Vital Sign    Worried About Running Out of Food in the Last Year: Never true    Ran Out of Food in the Last Year: Never true  Transportation Needs: No Transportation Needs (03/12/2023)   PRAPARE - Administrator, Civil Service (Medical): No    Lack of Transportation (Non-Medical): No  Physical Activity: Sufficiently Active (03/12/2023)   Exercise Vital Sign    Days of Exercise per Week: 6 days    Minutes of Exercise per Session: 90 min  Stress: No Stress Concern Present (03/12/2023)   [employer] of Occupational Health - Occupational Stress Questionnaire    Feeling of Stress : Not at all  Social Connections: Moderately Integrated (03/12/2023)   Social Connection and Isolation Panel [NHANES]    Frequency of Communication with Friends and Family: More than three times a week    Frequency of Social Gatherings with Friends and Family: Twice a week     Attends Religious Services: More than 4 times per year    Active Member of [employer] or Organizations: No    Attends Banker Meetings: Never    Marital Status: Married  Catering manager Violence: Not At Risk (03/12/2023)   Humiliation, Afraid, Rape, and Kick questionnaire    Fear of Current or Ex-Partner: No    Emotionally Abused: No    Physically Abused: No    Sexually Abused: No     Current Outpatient Medications:    clobetasol (OLUX) 0.05 % topical foam, Apply topically 2 (two) times daily., Disp: 50 g, Rfl: 0   Fluocinolone Acetonide Scalp 0.01 % OIL, APPLY TO SCALP DAILY AS NEEDED, Disp: 118.28 mL, Rfl: 1   ketoconazole (NIZORAL) 2 % shampoo, Once weekly, lather on scalp, leave on 5 minutes, rinse well., Disp: 120 mL, Rfl: 3   Selenium Sulfide 2.3 % SHAM, Apply 5 mLs topically 3 (three) times a week., Disp: 180 mL, Rfl: 2   triamcinolone cream (KENALOG) 0.1 %, Apply 1 Application topically 2 (two) times daily., Disp: 453.6 g, Rfl: 0   Vitamin D, Ergocalciferol, (DRISDOL) 1.25 MG (50000 UNIT) CAPS capsule, Take 1 capsule (50,000 Units total) by mouth every 14 (fourteen) days. TAKE 1 CAPSULE BY MOUTH ONCE A WEEK, Disp: 6 capsule, Rfl: 1  No Known Allergies   ROS  Constitutional: Negative for fever , positive for weight change.  Respiratory: Negative for cough and shortness of breath.   Cardiovascular: Negative for chest pain or palpitations.  Gastrointestinal: Negative for abdominal pain, no bowel changes.  Musculoskeletal: Negative for gait problem or joint swelling.  Skin: Negative for rash.  Neurological: Negative for  dizziness or headache.  No other specific complaints in a complete review of systems (except as listed in HPI above).   Objective  Vitals:   03/12/23 1317  BP: 124/72  Pulse: 72  Resp: 16  SpO2: 96%  Weight: 181 lb (82.1 kg)  Height: 5\' 3"  (1.6 m)    Body mass index is 32.06 kg/m.  Physical Exam  Constitutional: Patient appears  well-developed and well-nourished. No distress.  HENT: Head: Normocephalic and atraumatic. Ears: B TMs ok, no erythema or effusion; Nose: Nose normal. Mouth/Throat: Oropharynx is clear and moist. No oropharyngeal exudate.  Eyes: Conjunctivae and EOM are normal. Pupils are equal, round, and reactive to light. No scleral icterus.  Neck: Normal range of motion. Neck supple. No JVD present. No thyromegaly present.  Cardiovascular: Normal rate, regular rhythm and normal heart sounds.  No murmur heard. No BLE edema. Pulmonary/Chest: Effort normal and breath sounds normal. No respiratory distress. Abdominal: Soft. Bowel sounds are normal, no distension. There is no tenderness. no masses Breast: no lumps or masses, no nipple discharge or rashes FEMALE GENITALIA:  Not done  RECTAL: not done  Musculoskeletal: Normal range of motion, no joint effusions. No gross deformities Neurological: he is alert and oriented to person, place, and time. No cranial nerve deficit. Coordination, balance, strength, speech and gait are normal.  Skin: Skin is warm and dry. No rash noted. No erythema.  Psychiatric: Patient has a normal mood and affect. behavior is normal. Judgment and thought content normal.   Fall Risk:    03/12/2023    1:23 PM 12/25/2022    9:11 AM 10/24/2022    3:45 PM 05/15/2022    7:42 AM 04/25/2022    3:26 PM  Fall Risk   Falls in the past year? 0 0 0 0 0  Number falls in past yr: 0 0 0 0   Injury with Fall? 0 0 0 0   Risk for fall due to : No Fall Risks No Fall Risks No Fall Risks No Fall Risks   Follow up Falls prevention discussed Falls prevention discussed Falls prevention discussed Falls prevention discussed      Functional Status Survey: Is the patient deaf or have difficulty hearing?: No Does the patient have difficulty seeing, even when wearing glasses/contacts?: No Does the patient have difficulty concentrating, remembering, or making decisions?: No Does the patient have difficulty  walking or climbing stairs?: No Does the patient have difficulty dressing or bathing?: No Does the patient have difficulty doing errands alone such as visiting a doctor's office or shopping?: No   Assessment & Plan  1. Well adult exam  - Lipid panel - Microalbumin / creatinine urine ratio - CBC with Differential/Platelet - COMPLETE METABOLIC PANEL WITH GFR - VITAMIN D 25 Hydroxy (Vit-D Deficiency, Fractures)  2. Need for immunization against influenza  - Flu vaccine trivalent PF, 6mos and older(Flulaval,Afluria,Fluarix,Fluzone)  3. Vitamin D deficiency  - VITAMIN D 25 Hydroxy (Vit-D Deficiency, Fractures)  4. Alpha thalassemia minor   5. Dyslipidemia associated with type 2 diabetes mellitus (HCC)  - Lipid panel  6. Diabetes mellitus with microalbuminuria (HCC)  - Microalbumin / creatinine urine ratio - COMPLETE METABOLIC PANEL WITH GFR  7. Breast cancer screening by mammogram  - MM 3D SCREENING MAMMOGRAM BILATERAL BREAST; Future    -USPSTF grade A and B recommendations reviewed with patient; age-appropriate recommendations, preventive care, screening tests, etc discussed and encouraged; healthy living encouraged; see AVS for patient education given to patient -Discussed importance of  150 minutes of physical activity weekly, eat two servings of fish weekly, eat one serving of tree nuts ( cashews, pistachios, pecans, almonds.Marland Kitchen) every other day, eat 6 servings of fruit/vegetables daily and drink plenty of water and avoid sweet beverages.   -Reviewed Health Maintenance: Yes.

## 2023-03-13 ENCOUNTER — Encounter: Payer: Self-pay | Admitting: Family Medicine

## 2023-03-13 LAB — COMPLETE METABOLIC PANEL WITH GFR
AG Ratio: 1.6 (calc) (ref 1.0–2.5)
ALT: 20 U/L (ref 6–29)
AST: 22 U/L (ref 10–35)
Albumin: 4.8 g/dL (ref 3.6–5.1)
Alkaline phosphatase (APISO): 45 U/L (ref 37–153)
BUN: 14 mg/dL (ref 7–25)
CO2: 25 mmol/L (ref 20–32)
Calcium: 9.5 mg/dL (ref 8.6–10.4)
Chloride: 104 mmol/L (ref 98–110)
Creat: 0.87 mg/dL (ref 0.50–1.03)
Globulin: 3 g/dL (ref 1.9–3.7)
Glucose, Bld: 83 mg/dL (ref 65–99)
Potassium: 4.6 mmol/L (ref 3.5–5.3)
Sodium: 143 mmol/L (ref 135–146)
Total Bilirubin: 0.7 mg/dL (ref 0.2–1.2)
Total Protein: 7.8 g/dL (ref 6.1–8.1)
eGFR: 80 mL/min/{1.73_m2} (ref >=60)

## 2023-03-13 LAB — CBC WITH DIFFERENTIAL/PLATELET
Absolute Monocytes: 536 {cells}/uL (ref 200–950)
Basophils Absolute: 60 {cells}/uL (ref 0–200)
Basophils Relative: 0.9 %
Eosinophils Absolute: 248 {cells}/uL (ref 15–500)
Eosinophils Relative: 3.7 %
HCT: 42.8 % (ref 35.0–45.0)
Hemoglobin: 12.8 g/dL (ref 11.7–15.5)
Lymphs Abs: 3404 {cells}/uL (ref 850–3900)
MCH: 21.8 pg — ABNORMAL LOW (ref 27.0–33.0)
MCHC: 29.9 g/dL — ABNORMAL LOW (ref 32.0–36.0)
MCV: 73 fL — ABNORMAL LOW (ref 80.0–100.0)
MPV: 11.3 fL (ref 7.5–12.5)
Monocytes Relative: 8 %
Neutro Abs: 2452 {cells}/uL (ref 1500–7800)
Neutrophils Relative %: 36.6 %
Platelets: 289 10*3/uL (ref 140–400)
RBC: 5.86 10*6/uL — ABNORMAL HIGH (ref 3.80–5.10)
RDW: 14.1 % (ref 11.0–15.0)
Total Lymphocyte: 50.8 %
WBC: 6.7 10*3/uL (ref 3.8–10.8)

## 2023-03-13 LAB — MICROALBUMIN / CREATININE URINE RATIO
Creatinine, Urine: 172 mg/dL (ref 20–275)
Microalb Creat Ratio: 21 mg/g{creat} (ref <30)
Microalb, Ur: 3.6 mg/dL

## 2023-03-13 LAB — VITAMIN D 25 HYDROXY (VIT D DEFICIENCY, FRACTURES): Vit D, 25-Hydroxy: 50 ng/mL (ref 30–100)

## 2023-03-13 LAB — LIPID PANEL
Cholesterol: 193 mg/dL (ref <200)
HDL: 42 mg/dL — ABNORMAL LOW (ref >=50)
LDL Cholesterol (Calc): 126 mg/dL — ABNORMAL HIGH
Non-HDL Cholesterol (Calc): 151 mg/dL — ABNORMAL HIGH (ref <130)
Total CHOL/HDL Ratio: 4.6 (calc) (ref <5.0)
Triglycerides: 137 mg/dL (ref <150)

## 2023-03-28 NOTE — Progress Notes (Unsigned)
Name: Brittney Higgins   MRN: [id number]    DOB: September 07, 1970   Date:03/29/2023       Progress Note  Subjective  Chief Complaint  Follow Up  HPI  Dyslipidemia: she is wiling to try statin therapy. Discussed possible side effects   The 10-year ASCVD risk score (Arnett DK, et al., 2019) is: 6.3%   Values used to calculate the score:     Age: 52 years     Sex: Female     Is Non-Hispanic African American: Yes     Diabetic: Yes     Tobacco smoker: No     Systolic Blood Pressure: 122 mmHg     Is BP treated: No     HDL Cholesterol: 42 mg/dL     Total Cholesterol: 193 mg/dL    Diabetes type II: B1Y has been below 7 %, it was 6.9% in May 2024, hse has changed her diet, exercise more, A1C today is down to 6.2 %. She also retired. She has been losing weight also . She denies polyphagia, polydipsia or polyuria   Hot Flashes/Night Sweats: symptoms are now worse at night, waking up multiple times per night. We will try clonidine   Chronic constipation: she states when she started to walk/run daily she was able to have a bowel movement daily but now that her body got used to it she is back to once or twice a week   Alpha Thalassemia: seen by Dr. Cathie Hoops, chronic microcytosis. Unchanged    Seborrhea capitis: she has seen Dr. Roseanne Reno, she is doing better, she still has a bald spot but the flakiness has improved, using clobetasol foam and fluocinonide oil also nizoral shampoo. No longer seeing Dermatologist. I have been feeling her symptoms   Vitamin D deficiency: taking rx vitamin D weekly .  Recheck labs next visit   Patient Active Problem List   Diagnosis Date Noted   Type 2 diabetes mellitus without complication, without long-term current use of insulin (HCC) 10/30/2022   Dyslipidemia associated with type 2 diabetes mellitus (HCC) 02/22/2022   Central centrifugal scarring alopecia 02/22/2022   Alpha thalassemia minor 02/10/2021   Polyp of sigmoid colon    Family history of colonic polyps 02/14/2019    Seborrheic dermatitis of scalp 01/08/2015   Lipoma of arm 01/08/2015   History of anemia 01/01/2015   Dyslipidemia 01/01/2015   Obesity (BMI 30.0-34.9) 01/01/2015   Vitamin D deficiency 01/01/2015   Chronic constipation 01/01/2015   Fibroid 01/01/2015   Bunion 01/01/2015    Past Surgical History:  Procedure Laterality Date   BREAST CYST ASPIRATION  age 62   COLONOSCOPY WITH PROPOFOL N/A 02/18/2019   Procedure: COLONOSCOPY WITH PROPOFOL;  Surgeon: Midge Minium, MD;  Location: [facility];  Service: Endoscopy;  Laterality: N/A;   DILITATION & CURRETTAGE/HYSTROSCOPY WITH NOVASURE ABLATION N/A 06/04/2013   Procedure: DILATATION & CURETTAGE/HYSTEROSCOPY WITH NOVASURE ABLATION WITH ATTEMPTED VERSAPOINT;  Surgeon: Lenoard Aden, MD;  Location: [facility];  Service: Gynecology;  Laterality: N/A;    Family History  Problem Relation Age of Onset   Hypertension Mother    COPD Mother    Colon polyps Father    Heart disease Father    CAD Father    AAA (abdominal aortic aneurysm) Father    Diabetes Father    Dementia Father    Other Father        Kidney Failure   Hypertension Sister    Diabetes Sister    Sleep apnea Sister  Asthma Son    Diabetes Sister    Hypertension Sister    Diabetes Sister    Hypertension Sister    Breast cancer Neg Hx     Social History   Tobacco Use   Smoking status: Never    Passive exposure: Never   Smokeless tobacco: Never  Substance Use Topics   Alcohol use: No    Alcohol/week: 0.0 standard drinks of alcohol     Current Outpatient Medications:    clobetasol (OLUX) 0.05 % topical foam, Apply topically 2 (two) times daily., Disp: 50 g, Rfl: 0   Fluocinolone Acetonide Scalp 0.01 % OIL, APPLY TO SCALP DAILY AS NEEDED, Disp: 118.28 mL, Rfl: 1   ketoconazole (NIZORAL) 2 % shampoo, Once weekly, lather on scalp, leave on 5 minutes, rinse well., Disp: 120 mL, Rfl: 3   Selenium Sulfide 2.3 % SHAM, Apply 5 mLs topically 3 (three) times a week., Disp:  180 mL, Rfl: 2   triamcinolone cream (KENALOG) 0.1 %, Apply 1 Application topically 2 (two) times daily., Disp: 453.6 g, Rfl: 0   Vitamin D, Ergocalciferol, (DRISDOL) 1.25 MG (50000 UNIT) CAPS capsule, Take 1 capsule (50,000 Units total) by mouth every 14 (fourteen) days. TAKE 1 CAPSULE BY MOUTH ONCE A WEEK, Disp: 6 capsule, Rfl: 1  No Known Allergies  I personally reviewed active problem list, medication list, allergies, family history, social history, health maintenance with the patient/caregiver today.   ROS  Ten systems reviewed and is negative except as mentioned in HPI    Objective  Vitals:   03/29/23 0852  BP: 122/70  Pulse: 66  Resp: 14  Temp: 98 F (36.7 C)  TempSrc: Oral  SpO2: 99%  Weight: 175 lb 14.4 oz (79.8 kg)  Height: 5\' 4"  (1.626 m)    Body mass index is 30.19 kg/m.  Physical Exam  Constitutional: Patient appears well-developed and well-nourished. Obese  No distress.  HEENT: head atraumatic, normocephalic, pupils equal and reactive to light, neck supple Cardiovascular: Normal rate, regular rhythm and normal heart sounds.  No murmur heard. No BLE edema. Pulmonary/Chest: Effort normal and breath sounds normal. No respiratory distress. Abdominal: Soft.  There is no tenderness. Psychiatric: Patient has a normal mood and affect. behavior is normal. Judgment and thought content normal.    PHQ2/9:    03/29/2023    8:54 AM 03/12/2023    1:23 PM 12/25/2022    9:11 AM 10/24/2022    3:45 PM 05/15/2022    7:42 AM  Depression screen PHQ 2/9  Decreased Interest 0 0 0 0 0  Down, Depressed, Hopeless 0 0 2 1 0  PHQ - 2 Score 0 0 2 1 0  Altered sleeping 0 0 0 0 0  Tired, decreased energy 0 0 0 1 0  Change in appetite 0 0 0 0 0  Feeling bad or failure about yourself  0 0 0 1 0  Trouble concentrating 0 0 0 1 0  Moving slowly or fidgety/restless 0 0 0 0 0  Suicidal thoughts 0 0 0 0 0  PHQ-9 Score 0 0 2 4 0    phq 9 is negative   Fall Risk:    03/29/2023     8:54 AM 03/12/2023    1:23 PM 12/25/2022    9:11 AM 10/24/2022    3:45 PM 05/15/2022    7:42 AM  Fall Risk   Falls in the past year? 0 0 0 0 0  Number falls in past yr:  0  0 0 0  Injury with Fall?  0 0 0 0  Risk for fall due to : No Fall Risks No Fall Risks No Fall Risks No Fall Risks No Fall Risks  Follow up Falls prevention discussed Falls prevention discussed Falls prevention discussed Falls prevention discussed Falls prevention discussed      Functional Status Survey: Is the patient deaf or have difficulty hearing?: No Does the patient have difficulty seeing, even when wearing glasses/contacts?: No Does the patient have difficulty concentrating, remembering, or making decisions?: No Does the patient have difficulty walking or climbing stairs?: No Does the patient have difficulty dressing or bathing?: No Does the patient have difficulty doing errands alone such as visiting a doctor's office or shopping?: No    Assessment & Plan  1. Dyslipidemia associated with type 2 diabetes mellitus (HCC)  - POCT HgB A1C - rosuvastatin (CRESTOR) 10 MG tablet; Take 1 tablet (10 mg total) by mouth daily.  Dispense: 90 tablet; Refill: 0  2. Night sweats  - cloNIDine (CATAPRES) 0.1 MG tablet; Take 1 tablet (0.1 mg total) by mouth at bedtime.  Dispense: 90 tablet; Refill: 0  3. Alpha thalassemia minor  Unchanged   4. Vitamin D deficiency  - Vitamin D, Ergocalciferol, (DRISDOL) 1.25 MG (50000 UNIT) CAPS capsule; Take 1 capsule (50,000 Units total) by mouth every 14 (fourteen) days.  Dispense: 6 capsule; Refill: 1  5. Central centrifugal scarring alopecia  - ketoconazole (NIZORAL) 2 % shampoo; Once weekly, lather on scalp, leave on 5 minutes, rinse well.  Dispense: 120 mL; Refill: 3 - Fluocinolone Acetonide Scalp 0.01 % OIL; APPLY TO SCALP DAILY AS NEEDED  Dispense: 118.28 mL; Refill: 1

## 2023-03-29 ENCOUNTER — Other Ambulatory Visit: Payer: Self-pay

## 2023-03-29 ENCOUNTER — Ambulatory Visit: Payer: BC Managed Care – PPO | Admitting: Family Medicine

## 2023-03-29 ENCOUNTER — Encounter: Payer: Self-pay | Admitting: Family Medicine

## 2023-03-29 VITALS — BP 122/70 | HR 66 | Temp 98.0°F | Resp 14 | Ht 64.0 in | Wt 175.9 lb

## 2023-03-29 DIAGNOSIS — R61 Generalized hyperhidrosis: Secondary | ICD-10-CM | POA: Diagnosis not present

## 2023-03-29 DIAGNOSIS — E1169 Type 2 diabetes mellitus with other specified complication: Secondary | ICD-10-CM

## 2023-03-29 DIAGNOSIS — E559 Vitamin D deficiency, unspecified: Secondary | ICD-10-CM | POA: Diagnosis not present

## 2023-03-29 DIAGNOSIS — E785 Hyperlipidemia, unspecified: Secondary | ICD-10-CM | POA: Diagnosis not present

## 2023-03-29 DIAGNOSIS — L6681 Central centrifugal cicatricial alopecia: Secondary | ICD-10-CM

## 2023-03-29 DIAGNOSIS — D563 Thalassemia minor: Secondary | ICD-10-CM | POA: Diagnosis not present

## 2023-03-29 LAB — POCT GLYCOSYLATED HEMOGLOBIN (HGB A1C): Hemoglobin A1C: 6.2 % — AB (ref 4.0–5.6)

## 2023-03-29 MED ORDER — CLONIDINE HCL 0.1 MG PO TABS: 0.1000 mg | ORAL_TABLET | Freq: Every evening | ORAL | 0 refills | Status: DC

## 2023-03-29 MED ORDER — CLOBETASOL PROPIONATE 0.05 % EX FOAM: Freq: Two times a day (BID) | CUTANEOUS | 1 refills | Status: DC

## 2023-03-29 MED ORDER — VITAMIN D (ERGOCALCIFEROL) 1.25 MG (50000 UNIT) PO CAPS: 50000.0000 [IU] | ORAL_CAPSULE | ORAL | 1 refills | Status: DC

## 2023-03-29 MED ORDER — FLUOCINOLONE ACETONIDE SCALP 0.01 % EX OIL: TOPICAL_OIL | CUTANEOUS | 1 refills | Status: DC

## 2023-03-29 MED ORDER — KETOCONAZOLE 2 % EX SHAM: MEDICATED_SHAMPOO | CUTANEOUS | 3 refills | Status: DC

## 2023-03-29 MED ORDER — ROSUVASTATIN CALCIUM 10 MG PO TABS: 10.0000 mg | ORAL_TABLET | Freq: Every day | ORAL | 0 refills | Status: DC

## 2023-06-18 ENCOUNTER — Encounter: Payer: Self-pay | Admitting: Family Medicine

## 2023-06-27 ENCOUNTER — Ambulatory Visit
Admission: RE | Admit: 2023-06-27 | Discharge: 2023-06-27 | Disposition: A | Discharge status: Home / Self Care | Payer: 59 | Source: Ambulatory Visit | Attending: Family Medicine | Admitting: Family Medicine

## 2023-06-27 DIAGNOSIS — Z1231 Encounter for screening mammogram for malignant neoplasm of breast: Secondary | ICD-10-CM | POA: Insufficient documentation

## 2023-06-28 ENCOUNTER — Encounter: Payer: Self-pay | Admitting: Family Medicine

## 2023-06-28 ENCOUNTER — Ambulatory Visit: Payer: 59 | Admitting: Family Medicine

## 2023-06-28 VITALS — BP 134/82 | HR 65 | Resp 16 | Ht 64.0 in | Wt 170.3 lb

## 2023-06-28 DIAGNOSIS — E1169 Type 2 diabetes mellitus with other specified complication: Secondary | ICD-10-CM

## 2023-06-28 DIAGNOSIS — E785 Hyperlipidemia, unspecified: Secondary | ICD-10-CM | POA: Diagnosis not present

## 2023-06-28 DIAGNOSIS — L309 Dermatitis, unspecified: Secondary | ICD-10-CM

## 2023-06-28 DIAGNOSIS — L6681 Central centrifugal cicatricial alopecia: Secondary | ICD-10-CM | POA: Diagnosis not present

## 2023-06-28 DIAGNOSIS — Z23 Encounter for immunization: Secondary | ICD-10-CM | POA: Diagnosis not present

## 2023-06-28 DIAGNOSIS — L219 Seborrheic dermatitis, unspecified: Secondary | ICD-10-CM | POA: Diagnosis not present

## 2023-06-28 DIAGNOSIS — D563 Thalassemia minor: Secondary | ICD-10-CM | POA: Diagnosis not present

## 2023-06-28 DIAGNOSIS — E559 Vitamin D deficiency, unspecified: Secondary | ICD-10-CM

## 2023-06-28 LAB — POCT GLYCOSYLATED HEMOGLOBIN (HGB A1C): Hemoglobin A1C: 6.4 % — AB (ref 4.0–5.6)

## 2023-06-28 MED ORDER — FLUOCINOLONE ACETONIDE SCALP 0.01 % EX OIL: TOPICAL_OIL | CUTANEOUS | 1 refills | Status: DC

## 2023-06-28 MED ORDER — CLOBETASOL PROPIONATE 0.05 % EX FOAM: Freq: Two times a day (BID) | CUTANEOUS | 1 refills | Status: DC

## 2023-06-28 MED ORDER — SELENIUM SULFIDE 2.3 % EX SHAM: 5.0000 mL | MEDICATED_SHAMPOO | CUTANEOUS | 2 refills | Status: DC

## 2023-06-28 MED ORDER — ROSUVASTATIN CALCIUM 10 MG PO TABS: 10.0000 mg | ORAL_TABLET | Freq: Every day | ORAL | 1 refills | Status: DC

## 2023-06-28 MED ORDER — KETOCONAZOLE 2 % EX SHAM: MEDICATED_SHAMPOO | CUTANEOUS | 3 refills | Status: DC

## 2023-06-28 MED ORDER — VITAMIN D (ERGOCALCIFEROL) 1.25 MG (50000 UNIT) PO CAPS: 50000.0000 [IU] | ORAL_CAPSULE | ORAL | 1 refills | Status: DC

## 2023-06-28 NOTE — Progress Notes (Signed)
Name: Brittney Higgins   MRN: [id number]    DOB: 1970-08-20   Date:06/28/2023       Progress Note  Subjective  Chief Complaint  Chief Complaint  Patient presents with   Medical Management of Chronic Issues   HPI   Dyslipidemia: she is wiling to try statin therapy. Discussed possible side effects   Diabetes type II: A1C has been below 7 %, it was 6.9% in May 2024, hse has changed her diet, exercise more, A1C down to  6.2 % with diet and exercise , weight is down 20 lbs in the past year, but A1C slightly higher at 6.4 % today.. She also retired.  She denies polyphagia, polydipsia or polyuria . She has associated dyslipidemia and obesity.    Hot Flashes/Night Sweats: symptoms are now worse at night, waking up multiple times per night. I sent Clonidine in the fall, she never picked the rx up but hot flashes resolved since    Chronic constipation: she states when she started to walk/run daily she was able to have a bowel movement daily but now that her body got used to it she is back to once or twice a week   Alpha Thalassemia: seen by Dr. Cathie Hoops, chronic microcytosis. Unchanged   Seborrhea capitis: she has seen Dr. Roseanne Reno, she is doing better, she still has a bald spot but the flakiness has improved but still present, using clobetasol foam and fluocinonide oil also nizoral shampoo. No longer seeing Dermatologist.    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   Alpha thalassemia minor 02/10/2021   Polyp of sigmoid colon    Family history of colonic polyps 02/14/2019   Seborrheic dermatitis of scalp 01/08/2015   Lipoma of arm 01/08/2015   History of anemia 01/01/2015   Dyslipidemia 01/01/2015    Obesity (BMI 30.0-34.9) 01/01/2015   Vitamin D deficiency 01/01/2015   Chronic constipation 01/01/2015   Fibroid 01/01/2015   Bunion 01/01/2015    Past Surgical History:  Procedure Laterality Date   BREAST CYST ASPIRATION  age 44   COLONOSCOPY WITH PROPOFOL N/A 02/18/2019   Procedure: COLONOSCOPY WITH PROPOFOL;  Surgeon: Midge Minium, MD;  Location: [facility]COPY;  Service: Endoscopy;  Laterality: N/A;   DILITATION & CURRETTAGE/HYSTROSCOPY WITH NOVASURE ABLATION N/A 06/04/2013   Procedure: DILATATION & CURETTAGE/HYSTEROSCOPY WITH NOVASURE ABLATION WITH ATTEMPTED VERSAPOINT;  Surgeon: Lenoard Aden, MD;  Location: [facility]S;  Service: Gynecology;  Laterality: N/A;    Family History  Problem Relation Age of Onset   Hypertension Mother    COPD Mother    Colon polyps Father    Heart disease Father    CAD Father    AAA (abdominal aortic aneurysm) Father    Diabetes Father    Dementia Father    Other Father        Kidney Failure   Hypertension Sister    Diabetes Sister    Sleep apnea Sister    Asthma Son    Diabetes Sister    Hypertension Sister    Diabetes Sister    Hypertension Sister    Breast cancer Neg Hx     Social History   Tobacco Use   Smoking status: Never  Passive exposure: Never   Smokeless tobacco: Never  Substance Use Topics   Alcohol use: No    Alcohol/week: 0.0 standard drinks of alcohol     Current Outpatient Medications:    clobetasol (OLUX) 0.05 % topical foam, Apply topically 2 (two) times daily., Disp: 50 g, Rfl: 1   Fluocinolone Acetonide Scalp 0.01 % OIL, APPLY TO SCALP DAILY AS NEEDED, Disp: 118.28 mL, Rfl: 1   ketoconazole (NIZORAL) 2 % shampoo, Once weekly, lather on scalp, leave on 5 minutes, rinse well., Disp: 120 mL, Rfl: 3   Selenium Sulfide 2.3 % SHAM, Apply 5 mLs topically 3 (three) times a week., Disp: 180 mL, Rfl: 2   triamcinolone cream (KENALOG) 0.1 %, Apply 1 Application topically 2 (two) times daily., Disp: 453.6 g, Rfl: 0    cloNIDine (CATAPRES) 0.1 MG tablet, Take 1 tablet (0.1 mg total) by mouth at bedtime. (Patient not taking: Reported on 06/28/2023), Disp: 90 tablet, Rfl: 0   rosuvastatin (CRESTOR) 10 MG tablet, Take 1 tablet (10 mg total) by mouth daily. (Patient not taking: Reported on 06/28/2023), Disp: 90 tablet, Rfl: 0   Vitamin D, Ergocalciferol, (DRISDOL) 1.25 MG (50000 UNIT) CAPS capsule, Take 1 capsule (50,000 Units total) by mouth every 14 (fourteen) days. (Patient not taking: Reported on 06/28/2023), Disp: 6 capsule, Rfl: 1  No Known Allergies  I personally reviewed active problem list, medication list, allergies, family history with the patient/caregiver today.   ROS  Ten systems reviewed and is negative except as mentioned in HPI    Objective  Vitals:   06/28/23 0849  BP: 134/82  Pulse: 65  Resp: 16  SpO2: 98%  Weight: 170 lb 4.8 oz (77.2 kg)  Height: 5\' 4"  (1.626 m)    Body mass index is 29.23 kg/m.  Physical Exam  Constitutional: Patient appears well-developed and well-nourished. No distress.  HEENT: head atraumatic, normocephalic, pupils equal and reactive to light, neck supple Cardiovascular: Normal rate, regular rhythm and normal heart sounds.  No murmur heard. No BLE edema. Pulmonary/Chest: Effort normal and breath sounds normal. No respiratory distress. Scalp: flakiness  Abdominal: Soft.  There is no tenderness. Psychiatric: Patient has a normal mood and affect. behavior is normal. Judgment and thought content normal.   Recent Results (from the past 2160 hours)  POCT glycosylated hemoglobin (Hb A1C)     Status: Abnormal   Collection Time: 06/28/23  8:55 AM  Result Value Ref Range   Hemoglobin A1C 6.4 (A) 4.0 - 5.6 %   HbA1c POC (<> result, manual entry)     HbA1c, POC (prediabetic range)     HbA1c, POC (controlled diabetic range)      Diabetic Foot Exam:     PHQ2/9:    06/28/2023    8:43 AM 03/29/2023    8:54 AM 03/12/2023    1:23 PM 12/25/2022    9:11 AM  10/24/2022    3:45 PM  Depression screen PHQ 2/9  Decreased Interest 0 0 0 0 0  Down, Depressed, Hopeless 0 0 0 2 1  PHQ - 2 Score 0 0 0 2 1  Altered sleeping 0 0 0 0 0  Tired, decreased energy 0 0 0 0 1  Change in appetite 0 0 0 0 0  Feeling bad or failure about yourself  0 0 0 0 1  Trouble concentrating 0 0 0 0 1  Moving slowly or fidgety/restless 0 0 0 0 0  Suicidal thoughts 0 0 0 0 0  PHQ-9 Score  0 0 0 2 4  Difficult doing work/chores Not difficult at all        phq 9 is negative  Fall Risk:    06/28/2023    8:43 AM 03/29/2023    8:54 AM 03/12/2023    1:23 PM 12/25/2022    9:11 AM 10/24/2022    3:45 PM  Fall Risk   Falls in the past year? 0 0 0 0 0  Number falls in past yr: 0  0 0 0  Injury with Fall? 0  0 0 0  Risk for fall due to : No Fall Risks No Fall Risks No Fall Risks No Fall Risks No Fall Risks  Follow up Falls prevention discussed;Education provided;Falls evaluation completed Falls prevention discussed Falls prevention discussed Falls prevention discussed Falls prevention discussed     Assessment & Plan  1. Dyslipidemia associated with type 2 diabetes mellitus (HCC) (Primary)  - POCT glycosylated hemoglobin (Hb A1C) - rosuvastatin (CRESTOR) 10 MG tablet; Take 1 tablet (10 mg total) by mouth daily.  Dispense: 90 tablet; Refill: 1  2. Alpha thalassemia minor  Unchanged   3. Central centrifugal scarring alopecia  - ketoconazole (NIZORAL) 2 % shampoo; Once weekly, lather on scalp, leave on 5 minutes, rinse well.  Dispense: 120 mL; Refill: 3 - Fluocinolone Acetonide Scalp 0.01 % OIL; APPLY TO SCALP DAILY AS NEEDED  Dispense: 118.28 mL; Refill: 1  4. Seborrheic dermatitis of scalp  - Selenium Sulfide 2.3 % SHAM; Apply 5 mLs topically 3 (three) times a week.  Dispense: 180 mL; Refill: 2  5. Dermatitis  Stable  6. Vitamin D deficiency  - Vitamin D, Ergocalciferol, (DRISDOL) 1.25 MG (50000 UNIT) CAPS capsule; Take 1 capsule (50,000 Units total) by mouth  every 14 (fourteen) days.  Dispense: 6 capsule; Refill: 1  7. Need for vaccination with 20-polyvalent pneumococcal conjugate vaccine  - Pneumococcal conjugate vaccine 20-valent (Prevnar 20)

## 2023-12-24 ENCOUNTER — Ambulatory Visit: Payer: Self-pay | Admitting: Family Medicine

## 2023-12-24 ENCOUNTER — Encounter: Payer: Self-pay | Admitting: Family Medicine

## 2023-12-24 ENCOUNTER — Ambulatory Visit
Admission: RE | Admit: 2023-12-24 | Discharge: 2023-12-24 | Disposition: A | Discharge status: Home / Self Care | Attending: Family Medicine | Admitting: Family Medicine

## 2023-12-24 ENCOUNTER — Ambulatory Visit
Admission: RE | Admit: 2023-12-24 | Discharge: 2023-12-24 | Disposition: A | Discharge status: Home / Self Care | Source: Ambulatory Visit | Attending: Family Medicine | Admitting: Family Medicine

## 2023-12-24 VITALS — BP 132/82 | HR 79 | Resp 16 | Ht 64.0 in | Wt 182.0 lb

## 2023-12-24 DIAGNOSIS — E1169 Type 2 diabetes mellitus with other specified complication: Secondary | ICD-10-CM | POA: Diagnosis not present

## 2023-12-24 DIAGNOSIS — M25552 Pain in left hip: Secondary | ICD-10-CM | POA: Insufficient documentation

## 2023-12-24 DIAGNOSIS — L219 Seborrheic dermatitis, unspecified: Secondary | ICD-10-CM

## 2023-12-24 DIAGNOSIS — M25551 Pain in right hip: Secondary | ICD-10-CM | POA: Diagnosis present

## 2023-12-24 DIAGNOSIS — E785 Hyperlipidemia, unspecified: Secondary | ICD-10-CM | POA: Diagnosis not present

## 2023-12-24 DIAGNOSIS — L6681 Central centrifugal cicatricial alopecia: Secondary | ICD-10-CM

## 2023-12-24 DIAGNOSIS — Z1211 Encounter for screening for malignant neoplasm of colon: Secondary | ICD-10-CM

## 2023-12-24 DIAGNOSIS — E559 Vitamin D deficiency, unspecified: Secondary | ICD-10-CM

## 2023-12-24 LAB — POCT GLYCOSYLATED HEMOGLOBIN (HGB A1C): Hemoglobin A1C: 6.2 % — AB (ref 4.0–5.6)

## 2023-12-24 NOTE — Progress Notes (Signed)
 Name: Brittney Higgins   MRN: [id number]    DOB: Jun 29, 1970   Date:12/24/2023       Progress Note  Subjective  Chief Complaint  Chief Complaint  Patient presents with   Medical Management of Chronic Issues   Discussed the use of AI scribe software for clinical note transcription with the patient, who gave verbal consent to proceed.  History of Present Illness Brittney Higgins is a 53 year old female who presents with recent COVID-19 infection and hip pain.  She initially experienced a sore throat and an aggravating cough, followed by feeling 'absolutely terrible' on Friday. A home COVID-19 test showed faint positive lines. By Saturday, her symptoms had resolved, and she felt 'perfect' on Sunday and Monday. She has not been checking her COVID status daily but reports feeling well since the initial symptoms subsided.  She reports gaining twelve pounds since her last visit, attributing it to reduced physical activity due to hip pain. The pain is located in the groin area. She has not had an x-ray or seen an orthopedic doctor for this issue. Walking exacerbates the pain, leading her to walk every other day or less frequently. She purchased new shoes, which resulted in blisters on her toes, further impacting her ability to walk.  She has a history of type 2 diabetes with dyslipidemia. Her A1c is currently 6.2%, improved from 6.4% previously, and she manages her diabetes through diet alone. She denies taking rosuvastatin , which was prescribed in January, and is not experiencing muscle pains. She feels 'really hungry' lately but denies increased urination or thirst beyond her usual water intake.  She has a history of alopecia and seborrheic dermatitis, for which she uses various creams, lotions, and shampoos. She continues to take vitamin D  as prescribed.    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   Alpha thalassemia minor 02/10/2021   Polyp of sigmoid colon    Family history of colonic polyps 02/14/2019   Seborrheic dermatitis of scalp 01/08/2015   Lipoma of arm 01/08/2015   History of anemia 01/01/2015   Dyslipidemia 01/01/2015   Obesity (BMI 30.0-34.9) 01/01/2015   Vitamin D  deficiency 01/01/2015   Chronic constipation 01/01/2015   Fibroid 01/01/2015   Bunion 01/01/2015    Past Surgical History:  Procedure Laterality Date   BREAST CYST ASPIRATION  age 82   COLONOSCOPY WITH PROPOFOL  N/A 02/18/2019   Procedure: COLONOSCOPY WITH PROPOFOL ;  Surgeon: Jinny Carmine, MD;  Location: [facility] ENDOSCOPY;  Service: Endoscopy;  Laterality: N/A;   DILITATION & CURRETTAGE/HYSTROSCOPY WITH NOVASURE ABLATION N/A 06/04/2013   Procedure: DILATATION & CURETTAGE/HYSTEROSCOPY WITH NOVASURE ABLATION WITH ATTEMPTED VERSAPOINT;  Surgeon: Charlie JINNY Flowers, MD;  Location: [facility] OR[facility];  Service: Gynecology;  Laterality: N/A;    Family History  Problem Relation Age of Onset   Hypertension Mother    COPD Mother    Colon polyps Father    Heart disease Father    CAD Father    AAA (abdominal aortic aneurysm) Father    Diabetes Father    Dementia Father    Other Father        Kidney Failure   Hypertension Sister    Diabetes Sister    Sleep apnea Sister    Asthma Son    Diabetes Sister  Hypertension Sister    Diabetes Sister    Hypertension Sister    Breast cancer Neg Hx     Social History   Tobacco Use   Smoking status: Never    Passive exposure: Never   Smokeless tobacco: Never  Substance Use Topics   Alcohol use: No    Alcohol/week: 0.0 standard drinks of alcohol     Current Outpatient Medications:    clobetasol  (OLUX ) 0.05 % topical foam, Apply topically 2 (two) times daily., Disp: 50 g, Rfl: 1   Fluocinolone  Acetonide Scalp 0.01 % OIL, APPLY TO SCALP  DAILY AS NEEDED, Disp: 118.28 mL, Rfl: 1   ketoconazole  (NIZORAL ) 2 % shampoo, Once weekly, lather on scalp, leave on 5 minutes, rinse well., Disp: 120 mL, Rfl: 3   rosuvastatin  (CRESTOR ) 10 MG tablet, Take 1 tablet (10 mg total) by mouth daily., Disp: 90 tablet, Rfl: 1   Selenium  Sulfide 2.3 % SHAM, Apply 5 mLs topically 3 (three) times a week., Disp: 180 mL, Rfl: 2   triamcinolone  cream (KENALOG ) 0.1 %, Apply 1 Application topically 2 (two) times daily., Disp: 453.6 g, Rfl: 0   Vitamin D , Ergocalciferol , (DRISDOL ) 1.25 MG (50000 UNIT) CAPS capsule, Take 1 capsule (50,000 Units total) by mouth every 14 (fourteen) days., Disp: 6 capsule, Rfl: 1  No Known Allergies  I personally reviewed active problem list, medication list, allergies, family history with the patient/caregiver today.   ROS  Ten systems reviewed and is negative except as mentioned in HPI    Objective Physical Exam  CONSTITUTIONAL: Patient appears well-developed and well-nourished. No distress. HEENT: Head atraumatic, normocephalic, neck supple. CARDIOVASCULAR: Normal rate, regular rhythm and normal heart sounds. No murmur heard. No BLE edema. PULMONARY: Effort normal and breath sounds normal. No respiratory distress. MUSCULOSKELETAL: Normal gait. Without gross motor or sensory deficit. Hip pain on palpation. Hip range of motion normal. PSYCHIATRIC: Patient has a normal mood and affect. Behavior is normal. Judgment and thought content normal.  Vitals:   12/24/23 0838  BP: 132/82  Pulse: 79  Resp: 16  SpO2: 99%  Weight: 182 lb (82.6 kg)  Height: 5' 4 (1.626 m)    Body mass index is 31.24 kg/m.   Diabetic Foot Exam:     PHQ2/9:    06/28/2023    8:43 AM 03/29/2023    8:54 AM 03/12/2023    1:23 PM 12/25/2022    9:11 AM 10/24/2022    3:45 PM  Depression screen PHQ 2/9  Decreased Interest 0 0 0 0 0  Down, Depressed, Hopeless 0 0 0 2 1  PHQ - 2 Score 0 0 0 2 1  Altered sleeping 0 0 0 0 0  Tired, decreased  energy 0 0 0 0 1  Change in appetite 0 0 0 0 0  Feeling bad or failure about yourself  0 0 0 0 1  Trouble concentrating 0 0 0 0 1  Moving slowly or fidgety/restless 0 0 0 0 0  Suicidal thoughts 0 0 0 0 0  PHQ-9 Score 0 0 0 2 4  Difficult doing work/chores Not difficult at all        phq 9 is negative  Fall Risk:    06/28/2023    8:43 AM 03/29/2023    8:54 AM 03/12/2023    1:23 PM 12/25/2022    9:11 AM 10/24/2022    3:45 PM  Fall Risk   Falls in the past year? 0 0 0 0 0  Number falls  in past yr: 0  0 0 0  Injury with Fall? 0  0 0 0  Risk for fall due to : No Fall Risks No Fall Risks No Fall Risks No Fall Risks No Fall Risks  Follow up Falls prevention discussed;Education provided;Falls evaluation completed Falls prevention discussed Falls prevention discussed Falls prevention discussed Falls prevention discussed     Assessment & Plan Bilateral hip pain, likely osteoarthritis Bilateral hip pain likely due to osteoarthritis, exacerbated by walking. - Order bilateral hip X-ray. - Recommend alternating walking with rest days. - Advise acetaminophen  500 mg twice daily.  Type 2 diabetes mellitus, diet-controlled with associated Dyslipidemia Type 2 diabetes well-controlled with diet, A1c at 6.2%. - Continue dietary management. - Schedule next A1c test at next physical exam.  Dyslipidemia Dyslipidemia with LDL not at goal. Discussed importance of lowering LDL to reduce cardiovascular risk. - Encourage starting rosuvastatin  10 mg every other day. - Discuss potential side effects and benefits of statin therapy.  Central centrifugal cicatricial alopecia Managed by a specialist with creams and shampoos.  Seborrheic dermatitis Doing well with topical medication   Vitamin D  deficiency, on supplementation Managed with supplementation. - Continue vitamin D  supplementation as prescribed.

## 2023-12-25 ENCOUNTER — Encounter: Payer: Self-pay | Admitting: Family Medicine

## 2023-12-27 ENCOUNTER — Other Ambulatory Visit: Payer: Self-pay

## 2023-12-27 ENCOUNTER — Telehealth: Payer: Self-pay

## 2023-12-27 DIAGNOSIS — Z860100 Personal history of colon polyps, unspecified: Secondary | ICD-10-CM

## 2023-12-27 MED ORDER — NA SULFATE-K SULFATE-MG SULF 17.5-3.13-1.6 GM/177ML PO SOLN: 1.0000 | Freq: Once | ORAL | 0 refills | Status: AC

## 2023-12-27 NOTE — Telephone Encounter (Signed)
 Gastroenterology Pre-Procedure Review  Request Date: 04/10/24 Requesting Physician: Dr. Jinny  PATIENT REVIEW QUESTIONS: The patient responded to the following health history questions as indicated:    1. Are you having any GI issues? no 2. Do you have a personal history of Polyps? yes (last colonoscopy performed by Dr. Jinny 02/18/19 recommended repeat in 5 years) 3. Do you have a family history of Colon Cancer or Polyps? yes (mother, sister and father colon polyps) 4. Diabetes Mellitus? no 5. Joint replacements in the past 12 months?no 6. Major health problems in the past 3 months?no 7. Any artificial heart valves, MVP, or defibrillator?no    MEDICATIONS & ALLERGIES:    Patient reports the following regarding taking any anticoagulation/antiplatelet therapy:   Plavix, Coumadin, Eliquis, Xarelto, Lovenox, Pradaxa, Brilinta, or Effient? no Aspirin? no  Patient confirms/reports the following medications:  Current Outpatient Medications  Medication Sig Dispense Refill   clobetasol  (OLUX ) 0.05 % topical foam Apply topically 2 (two) times daily. 50 g 1   Fluocinolone  Acetonide Scalp 0.01 % OIL APPLY TO SCALP DAILY AS NEEDED 118.28 mL 1   ketoconazole  (NIZORAL ) 2 % shampoo Once weekly, lather on scalp, leave on 5 minutes, rinse well. 120 mL 3   rosuvastatin  (CRESTOR ) 10 MG tablet Take 1 tablet (10 mg total) by mouth daily. 90 tablet 1   Selenium  Sulfide 2.3 % SHAM Apply 5 mLs topically 3 (three) times a week. 180 mL 2   triamcinolone  cream (KENALOG ) 0.1 % Apply 1 Application topically 2 (two) times daily. 453.6 g 0   Vitamin D , Ergocalciferol , (DRISDOL ) 1.25 MG (50000 UNIT) CAPS capsule Take 1 capsule (50,000 Units total) by mouth every 14 (fourteen) days. 6 capsule 1   No current facility-administered medications for this visit.    Patient confirms/reports the following allergies:  No Known Allergies  No orders of the defined types were placed in this encounter.   AUTHORIZATION  INFORMATION Primary Insurance: 1D#: Group #:  Secondary Insurance: 1D#: Group #:  SCHEDULE INFORMATION: Date: 04/10/24 Time: Location: [facility]

## 2023-12-28 ENCOUNTER — Telehealth: Payer: Self-pay

## 2023-12-28 NOTE — Telephone Encounter (Signed)
 Called patient about X-Ray results no answer left detailed vm to call us  back:  X-ray showed bilateral hip arthritis. It is mild but likely the cause of hip pain Also calcified fibroids that are usually not a problem if post menopausal

## 2024-01-03 LAB — OPHTHALMOLOGY REPORT-SCANNED

## 2024-01-17 ENCOUNTER — Telehealth: Payer: Self-pay

## 2024-01-17 NOTE — Telephone Encounter (Unsigned)
 Copied from CRM [id number]. Topic: Clinical - Lab/Test Results >> Jan 17, 2024  4:04 PM Brittney Higgins wrote: Reason for CRM: read note from provider to the patient  Patient would like a call back to ask additional questions about a treatment plan?  How to proceed in the future? Patient wants to ask more information about calcified fibroids? Is the bilateral hip arthritis in both hips?  Pt num [phone]

## 2024-01-18 ENCOUNTER — Other Ambulatory Visit: Payer: Self-pay | Admitting: Family Medicine

## 2024-01-18 DIAGNOSIS — M25551 Pain in right hip: Secondary | ICD-10-CM

## 2024-03-12 NOTE — Patient Instructions (Signed)
 Preventive Care 53-53 Years Old, Female  Preventive care refers to lifestyle choices and visits with your health care provider that can promote health and wellness. Preventive care visits are also called wellness exams.  What can I expect for my preventive care visit?  Counseling  Your health care provider may ask you questions about your:  Medical history, including:  Past medical problems.  Family medical history.  Pregnancy history.  Current health, including:  Menstrual cycle.  Method of birth control.  Emotional well-being.  Home life and relationship well-being.  Sexual activity and sexual health.  Lifestyle, including:  Alcohol, nicotine or tobacco, and drug use.  Access to firearms.  Diet, exercise, and sleep habits.  Work and work Astronomer.  Sunscreen use.  Safety issues such as seatbelt and bike helmet use.  Physical exam  Your health care provider will check your:  Height and weight. These may be used to calculate your BMI (body mass index). BMI is a measurement that tells if you are at a healthy weight.  Waist circumference. This measures the distance around your waistline. This measurement also tells if you are at a healthy weight and may help predict your risk of certain diseases, such as type 2 diabetes and high blood pressure.  Heart rate and blood pressure.  Body temperature.  Skin for abnormal spots.  What immunizations do I need?    Vaccines are usually given at various ages, according to a schedule. Your health care provider will recommend vaccines for you based on your age, medical history, and lifestyle or other factors, such as travel or where you work.  What tests do I need?  Screening  Your health care provider may recommend screening tests for certain conditions. This may include:  Lipid and cholesterol levels.  Diabetes screening. This is done by checking your blood sugar (glucose) after you have not eaten for a while (fasting).  Pelvic exam and Pap test.  Hepatitis B test.  Hepatitis C  test.  HIV (human immunodeficiency virus) test.  STI (sexually transmitted infection) testing, if you are at risk.  Lung cancer screening.  Colorectal cancer screening.  Mammogram. Talk with your health care provider about when you should start having regular mammograms. This may depend on whether you have a family history of breast cancer.  BRCA-related cancer screening. This may be done if you have a family history of breast, ovarian, tubal, or peritoneal cancers.  Bone density scan. This is done to screen for osteoporosis.  Talk with your health care provider about your test results, treatment options, and if necessary, the need for more tests.  Follow these instructions at home:  Eating and drinking    Eat a diet that includes fresh fruits and vegetables, whole grains, lean protein, and low-fat dairy products.  Take vitamin and mineral supplements as recommended by your health care provider.  Do not drink alcohol if:  Your health care provider tells you not to drink.  You are pregnant, may be pregnant, or are planning to become pregnant.  If you drink alcohol:  Limit how much you have to 0-1 drink a day.  Know how much alcohol is in your drink. In the [place] one drink equals one 12 oz bottle of beer (355 mL), one 5 oz glass of wine (148 mL), or one 1 oz glass of hard liquor (44 mL).  Lifestyle  Brush your teeth every morning and night with fluoride toothpaste. Floss one time each day.  Exercise for at least  30 minutes 5 or more days each week.  Do not use any products that contain nicotine or tobacco. These products include cigarettes, chewing tobacco, and vaping devices, such as e-cigarettes. If you need help quitting, ask your health care provider.  Do not use drugs.  If you are sexually active, practice safe sex. Use a condom or other form of protection to prevent STIs.  If you do not wish to become pregnant, use a form of birth control. If you plan to become pregnant, see your health care provider for a  prepregnancy visit.  Take aspirin only as told by your health care provider. Make sure that you understand how much to take and what form to take. Work with your health care provider to find out whether it is safe and beneficial for you to take aspirin daily.  Find healthy ways to manage stress, such as:  Meditation, yoga, or listening to music.  Journaling.  Talking to a trusted person.  Spending time with friends and family.  Minimize exposure to UV radiation to reduce your risk of skin cancer.  Safety  Always wear your seat belt while driving or riding in a vehicle.  Do not drive:  If you have been drinking alcohol. Do not ride with someone who has been drinking.  When you are tired or distracted.  While texting.  If you have been using any mind-altering substances or drugs.  Wear a helmet and other protective equipment during sports activities.  If you have firearms in your house, make sure you follow all gun safety procedures.  Seek help if you have been physically or sexually abused.  What's next?  Visit your health care provider once a year for an annual wellness visit.  Ask your health care provider how often you should have your eyes and teeth checked.  Stay up to date on all vaccines.  This information is not intended to replace advice given to you by your health care provider. Make sure you discuss any questions you have with your health care provider.  Document Revised: 11/10/2020 Document Reviewed: 11/10/2020  Elsevier Patient Education  2024 [employer].

## 2024-03-14 ENCOUNTER — Encounter: Payer: Self-pay | Admitting: Family Medicine

## 2024-03-14 ENCOUNTER — Ambulatory Visit (INDEPENDENT_AMBULATORY_CARE_PROVIDER_SITE_OTHER): Payer: Self-pay | Admitting: Family Medicine

## 2024-03-14 ENCOUNTER — Ambulatory Visit: Payer: Self-pay | Admitting: Family Medicine

## 2024-03-14 VITALS — BP 132/80 | HR 69 | Resp 16 | Ht 64.0 in | Wt 185.4 lb

## 2024-03-14 DIAGNOSIS — E559 Vitamin D deficiency, unspecified: Secondary | ICD-10-CM

## 2024-03-14 DIAGNOSIS — E1169 Type 2 diabetes mellitus with other specified complication: Secondary | ICD-10-CM

## 2024-03-14 DIAGNOSIS — Z23 Encounter for immunization: Secondary | ICD-10-CM

## 2024-03-14 DIAGNOSIS — N911 Secondary amenorrhea: Secondary | ICD-10-CM

## 2024-03-14 DIAGNOSIS — Z0000 Encounter for general adult medical examination without abnormal findings: Secondary | ICD-10-CM | POA: Diagnosis not present

## 2024-03-14 DIAGNOSIS — E785 Hyperlipidemia, unspecified: Secondary | ICD-10-CM

## 2024-03-14 DIAGNOSIS — Z7984 Long term (current) use of oral hypoglycemic drugs: Secondary | ICD-10-CM

## 2024-03-14 DIAGNOSIS — Z1231 Encounter for screening mammogram for malignant neoplasm of breast: Secondary | ICD-10-CM | POA: Diagnosis not present

## 2024-03-14 NOTE — Progress Notes (Signed)
 Name: Brittney Higgins   MRN: [id number]    DOB: 1970/06/14   Date:03/14/2024       Progress Note  Subjective  Chief Complaint  Chief Complaint  Patient presents with   Annual Exam    HPI  Patient presents for annual CPE.  Diet: she eats a balanced diet  Exercise: not able to be physically active due to bilateral hip pain, seeing ortho and will have steroid injection   Last Eye Exam: completed Last Dental Exam: completed  Flowsheet Row Office Visit from 03/14/2024 in [facility]  AUDIT-C Score 0   Depression: Phq 9 is  negative    03/14/2024    8:14 AM 06/28/2023    8:43 AM 03/29/2023    8:54 AM 03/12/2023    1:23 PM 12/25/2022    9:11 AM  Depression screen PHQ 2/9  Decreased Interest 0 0 0 0 0  Down, Depressed, Hopeless 0 0 0 0 2  PHQ - 2 Score 0 0 0 0 2  Altered sleeping  0 0 0 0  Tired, decreased energy  0 0 0 0  Change in appetite  0 0 0 0  Feeling bad or failure about yourself   0 0 0 0  Trouble concentrating  0 0 0 0  Moving slowly or fidgety/restless  0 0 0 0  Suicidal thoughts  0 0 0 0  PHQ-9 Score  0 0 0 2  Difficult doing work/chores  Not difficult at all      Hypertension: BP Readings from Last 3 Encounters:  03/14/24 132/80  12/24/23 132/82  06/28/23 134/82   Obesity: Wt Readings from Last 3 Encounters:  03/14/24 185 lb 6.4 oz (84.1 kg)  12/24/23 182 lb (82.6 kg)  06/28/23 170 lb 4.8 oz (77.2 kg)   BMI Readings from Last 3 Encounters:  03/14/24 31.82 kg/m  12/24/23 31.24 kg/m  06/28/23 29.23 kg/m     Vaccines: reviewed with the patient.   Hep C Screening: completed STD testing and prevention (HIV/chl/gon/syphilis): N/A Intimate partner violence: negative screen  Sexual History : no pain  Menstrual History/LMP/Abnormal Bleeding: s/p ablation, no bleeding, having perimenopausal symptoms, discussed WHI and is interested in HRT if FSH and LH in post menopausal range  Discussed importance of follow up if any  post-menopausal bleeding: yes  Incontinence Symptoms: negative for symptoms   Breast cancer:  - Last Mammogram: up to date - BRCA gene screening: N/A  Osteoporosis Prevention : Discussed high calcium  and vitamin D  supplementation, weight bearing exercises Bone density :not applicable   Cervical cancer screening: repeat in 2027   Skin cancer: Discussed monitoring for atypical lesions  Colorectal cancer: scheduled for Nov 13 th 2025    Lung cancer:  Low Dose CT Chest recommended if Age 53-80 years, 20 pack-year currently smoking OR have quit w/in 15years. Patient does not qualify for screen   ECG: next visit   Advanced Care Planning: A voluntary discussion about advance care planning including the explanation and discussion of advance directives.  Discussed health care proxy and Living will, and the patient was able to identify a health care proxy as husband .  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   Alpha thalassemia minor 02/10/2021   Polyp of sigmoid colon    Family history of colonic polyps 02/14/2019   Seborrheic dermatitis of scalp 01/08/2015   Lipoma of arm 01/08/2015   History of anemia 01/01/2015   Dyslipidemia 01/01/2015   Obesity (BMI 30.0-34.9) 01/01/2015   Vitamin D  deficiency 01/01/2015   Chronic constipation 01/01/2015   Fibroid 01/01/2015   Bunion 01/01/2015    Past Surgical History:  Procedure Laterality Date   BREAST CYST ASPIRATION  age 66   COLONOSCOPY WITH PROPOFOL  N/A 02/18/2019   Procedure: COLONOSCOPY WITH PROPOFOL ;  Surgeon: Jinny Carmine, MD;  Location: [facility] ENDOSCOPY;  Service: Endoscopy;  Laterality: N/A;    DILITATION & CURRETTAGE/HYSTROSCOPY WITH NOVASURE ABLATION N/A 06/04/2013   Procedure: DILATATION & CURETTAGE/HYSTEROSCOPY WITH NOVASURE ABLATION WITH ATTEMPTED VERSAPOINT;  Surgeon: Charlie JINNY Flowers, MD;  Location: [facility];  Service: Gynecology;  Laterality: N/A;    Family History  Problem Relation Age of Onset   Hypertension Mother    COPD Mother    Colon polyps Father    Heart disease Father    CAD Father    AAA (abdominal aortic aneurysm) Father    Diabetes Father    Dementia Father    Other Father        Kidney Failure   Hypertension Sister    Diabetes Sister    Sleep apnea Sister    Asthma Son    Diabetes Sister    Hypertension Sister    Diabetes Sister    Hypertension Sister    Breast cancer Neg Hx     Social History   Socioeconomic History   Marital status: Married    Spouse name: Alm   Number of children: 1   Years of education: Not on file   Highest education level: Master's degree (e.g., MA, MS, MEng, MEd, MSW, MBA)  Occupational History   Occupation: assistance principal     Employer: [employer]    Comment: [employer]  Tobacco Use   Smoking status: Never    Passive exposure: Never   Smokeless tobacco: Never  Vaping Use   Vaping status: Never Used  Substance and Sexual Activity   Alcohol use: No    Alcohol/week: 0.0 standard drinks of alcohol   Drug use: No   Sexual activity: Yes    Partners: Male    Birth control/protection: None  Other Topics Concern   Not on file  Social History Narrative   Not on file   Social Drivers of Health   Financial Resource Strain: Low Risk  (03/12/2024)   Received from [facility]   Overall Financial Resource Strain (CARDIA)    Difficulty of Paying Living Expenses: Not hard at all  Food Insecurity: No Food Insecurity (03/12/2024)   Received from [facility]   Hunger Vital Sign    Within the past 12 months, you worried that your food would run out before you got the money to  buy more.: Never true    Within the past 12 months, the food you bought just didn't last and you didn't have money to get more.: Never true  Transportation Needs: No Transportation Needs (03/12/2024)   Received from [facility] - Transportation    In the past 12 months, has lack of transportation kept you from medical appointments or from getting medications?: No    Lack of Transportation (Non-Medical): No  Physical Activity: Inactive (03/14/2024)   Exercise Vital Sign    Days of Exercise per Week: 0 days    Minutes of Exercise per Session: 0 min  Stress: Stress Concern Present (03/14/2024)   [employer] of Occupational Health - Occupational Stress Questionnaire    Feeling of Stress: Rather much  Social Connections: Moderately Integrated (03/14/2024)   Social Connection and Isolation Panel    Frequency of Communication with Friends and Family: More than three times a week    Frequency of Social Gatherings with Friends and Family: Twice a week    Attends Religious Services: More than 4 times per year    Active Member of [employer] or Organizations: No    Attends Banker Meetings: Never    Marital Status: Married  Catering manager Violence: Not At Risk (03/14/2024)   Humiliation, Afraid, Rape, and Kick questionnaire    Fear of Current or Ex-Partner: No    Emotionally Abused: No    Physically Abused: No    Sexually Abused: No     Current Outpatient Medications:    clobetasol  (OLUX ) 0.05 % topical foam, Apply topically 2 (two) times daily., Disp: 50 g, Rfl: 1   Fluocinolone  Acetonide Scalp 0.01 % OIL, APPLY TO SCALP DAILY AS NEEDED, Disp: 118.28 mL, Rfl: 1   ketoconazole  (NIZORAL ) 2 % shampoo, Once weekly, lather on scalp, leave on 5 minutes, rinse well., Disp: 120 mL, Rfl: 3   Selenium  Sulfide 2.3 % SHAM, Apply 5 mLs topically 3 (three) times a week., Disp: 180 mL, Rfl: 2   triamcinolone  cream (KENALOG ) 0.1 %, Apply 1 Application topically 2  (two) times daily., Disp: 453.6 g, Rfl: 0   Vitamin D , Ergocalciferol , (DRISDOL ) 1.25 MG (50000 UNIT) CAPS capsule, Take 1 capsule (50,000 Units total) by mouth every 14 (fourteen) days., Disp: 6 capsule, Rfl: 1   rosuvastatin  (CRESTOR ) 10 MG tablet, Take 1 tablet (10 mg total) by mouth daily. (Patient not taking: Reported on 03/14/2024), Disp: 90 tablet, Rfl: 1  No Known Allergies   ROS  Constitutional: Negative for fever or weight change.  Respiratory: Negative for cough and shortness of breath.   Cardiovascular: Negative for chest pain or palpitations.  Gastrointestinal: Negative for abdominal pain, no bowel changes.  Musculoskeletal: Negative for gait problem or joint swelling.  Skin: Negative for rash.  Neurological: Negative for dizziness or headache.  No other specific complaints in a complete review of systems (except as listed in HPI above).   Objective  Vitals:   03/14/24 0820  BP: 132/80  Pulse: 69  Resp: 16  SpO2: 97%  Weight: 185 lb 6.4 oz (84.1 kg)  Height: 5' 4 (1.626 m)    Body mass index is 31.82 kg/m.  Physical Exam  Constitutional: Patient appears well-developed and well-nourished. No distress.  HENT: Head: Normocephalic and atraumatic. Ears: B TMs ok, no erythema or effusion; Nose: Nose normal. Mouth/Throat: Oropharynx is clear and moist. No oropharyngeal exudate.  Eyes: Conjunctivae and EOM are normal. Pupils are equal, round, and reactive to light. No scleral icterus.  Neck: Normal range of motion. Neck supple. No JVD present. No thyromegaly present.  Cardiovascular: Normal rate, regular rhythm and normal heart sounds.  No murmur heard. No BLE edema. Pulmonary/Chest: Effort normal and breath sounds normal. No respiratory distress. Abdominal: Soft. Bowel sounds are normal, no distension. There is no tenderness. no masses Breast: no lumps or masses, no nipple discharge or rashes FEMALE GENITALIA:  Not done  RECTAL: not done  Musculoskeletal:  Normal  range of motion, no joint effusions. No gross deformities Neurological: he is alert and oriented to person, place, and time. No cranial nerve deficit. Coordination, balance, strength, speech and gait are normal.  Skin: Skin is warm and dry. No rash noted. No erythema.  Psychiatric: Patient has a normal mood and affect. behavior is normal. Judgment and thought content normal.     Assessment & Plan  1. Well adult exam (Primary)  - Flu vaccine trivalent PF, 6mos and older(Flulaval,Afluria,Fluarix,Fluzone) - Urine Microalbumin w/creat. ratio - Comprehensive metabolic panel with GFR - CBC with Differential/Platelet - Lipid panel - VITAMIN D  25 Hydroxy (Vit-D Deficiency, Fractures) - MM 3D SCREENING MAMMOGRAM BILATERAL BREAST; Future - FSH/LH  2. Encounter for screening mammogram for malignant neoplasm of breast  - MM 3D SCREENING MAMMOGRAM BILATERAL BREAST; Future  3. Vitamin D  deficiency  - VITAMIN D  25 Hydroxy (Vit-D Deficiency, Fractures)  4. Dyslipidemia associated with type 2 diabetes mellitus (HCC)  - Urine Microalbumin w/creat. ratio - Comprehensive metabolic panel with GFR - Lipid panel  5. Amenorrhea, secondary  - FSH/LH   -USPSTF grade A and B recommendations reviewed with patient; age-appropriate recommendations, preventive care, screening tests, etc discussed and encouraged; healthy living encouraged; see AVS for patient education given to patient -Discussed importance of 150 minutes of physical activity weekly, eat two servings of fish weekly, eat one serving of tree nuts ( cashews, pistachios, pecans, almonds.SABRA) every other day, eat 6 servings of fruit/vegetables daily and drink plenty of water and avoid sweet beverages.   -Reviewed Health Maintenance: Yes.

## 2024-03-21 LAB — COMPREHENSIVE METABOLIC PANEL WITH GFR
AG Ratio: 1.4 (calc) (ref 1.0–2.5)
ALT: 24 U/L (ref 6–29)
AST: 21 U/L (ref 10–35)
Albumin: 5 g/dL (ref 3.6–5.1)
Alkaline phosphatase (APISO): 59 U/L (ref 37–153)
BUN: 15 mg/dL (ref 7–25)
CO2: 25 mmol/L (ref 20–32)
Calcium: 9.4 mg/dL (ref 8.6–10.4)
Chloride: 100 mmol/L (ref 98–110)
Creat: 0.84 mg/dL (ref 0.50–1.03)
Globulin: 3.7 g/dL (ref 1.9–3.7)
Glucose, Bld: 123 mg/dL — ABNORMAL HIGH (ref 65–99)
Potassium: 3.7 mmol/L (ref 3.5–5.3)
Sodium: 138 mmol/L (ref 135–146)
Total Bilirubin: 0.7 mg/dL (ref 0.2–1.2)
Total Protein: 8.7 g/dL — ABNORMAL HIGH (ref 6.1–8.1)
eGFR: 83 mL/min/1.73m2 (ref >=60)

## 2024-03-21 LAB — LIPID PANEL
Cholesterol: 239 mg/dL — ABNORMAL HIGH (ref <200)
HDL: 46 mg/dL — ABNORMAL LOW (ref >=50)
LDL Cholesterol (Calc): 154 mg/dL — ABNORMAL HIGH
Non-HDL Cholesterol (Calc): 193 mg/dL — ABNORMAL HIGH (ref <130)
Total CHOL/HDL Ratio: 5.2 (calc) — ABNORMAL HIGH (ref <5.0)
Triglycerides: 218 mg/dL — ABNORMAL HIGH (ref <150)

## 2024-03-21 LAB — CBC WITH DIFFERENTIAL/PLATELET
Absolute Lymphocytes: 3229 {cells}/uL (ref 850–3900)
Absolute Monocytes: 380 {cells}/uL (ref 200–950)
Basophils Absolute: 62 {cells}/uL (ref 0–200)
Basophils Relative: 0.9 %
Eosinophils Absolute: 449 {cells}/uL (ref 15–500)
Eosinophils Relative: 6.5 %
HCT: 44.9 % (ref 35.0–45.0)
Hemoglobin: 14.1 g/dL (ref 11.7–15.5)
MCH: 22.2 pg — ABNORMAL LOW (ref 27.0–33.0)
MCHC: 31.4 g/dL — ABNORMAL LOW (ref 32.0–36.0)
MCV: 70.6 fL — ABNORMAL LOW (ref 80.0–100.0)
MPV: 12.2 fL (ref 7.5–12.5)
Monocytes Relative: 5.5 %
Neutro Abs: 2781 {cells}/uL (ref 1500–7800)
Neutrophils Relative %: 40.3 %
Platelets: 284 Thousand/uL (ref 140–400)
RBC: 6.36 Million/uL — ABNORMAL HIGH (ref 3.80–5.10)
RDW: 15.1 % — ABNORMAL HIGH (ref 11.0–15.0)
Total Lymphocyte: 46.8 %
WBC: 6.9 Thousand/uL (ref 3.8–10.8)

## 2024-03-21 LAB — FSH/LH
FSH: 49.4 m[IU]/mL
LH: 29.9 m[IU]/mL

## 2024-03-21 LAB — MICROALBUMIN / CREATININE URINE RATIO
Creatinine, Urine: 40 mg/dL (ref 20–275)
Microalb Creat Ratio: 23 mg/g{creat} (ref <30)
Microalb, Ur: 0.9 mg/dL

## 2024-03-21 LAB — VITAMIN D 25 HYDROXY (VIT D DEFICIENCY, FRACTURES): Vit D, 25-Hydroxy: 41 ng/mL (ref 30–100)

## 2024-03-23 ENCOUNTER — Other Ambulatory Visit: Payer: Self-pay | Admitting: Family Medicine

## 2024-03-23 DIAGNOSIS — E559 Vitamin D deficiency, unspecified: Secondary | ICD-10-CM

## 2024-03-24 ENCOUNTER — Ambulatory Visit: Payer: Self-pay | Admitting: Family Medicine

## 2024-04-09 ENCOUNTER — Encounter: Payer: Self-pay | Admitting: Family Medicine

## 2024-04-10 ENCOUNTER — Encounter: Admission: RE | Disposition: A | Payer: Self-pay | Source: Home / Self Care | Attending: Gastroenterology

## 2024-04-10 ENCOUNTER — Ambulatory Visit: Admitting: Anesthesiology

## 2024-04-10 ENCOUNTER — Encounter: Payer: Self-pay | Admitting: Gastroenterology

## 2024-04-10 ENCOUNTER — Ambulatory Visit
Admission: RE | Admit: 2024-04-10 | Discharge: 2024-04-10 | Disposition: A | Discharge status: Home / Self Care | Attending: Gastroenterology | Admitting: Gastroenterology

## 2024-04-10 ENCOUNTER — Other Ambulatory Visit: Payer: Self-pay | Admitting: Family Medicine

## 2024-04-10 DIAGNOSIS — K640 First degree hemorrhoids: Secondary | ICD-10-CM | POA: Insufficient documentation

## 2024-04-10 DIAGNOSIS — Z1211 Encounter for screening for malignant neoplasm of colon: Secondary | ICD-10-CM | POA: Diagnosis present

## 2024-04-10 DIAGNOSIS — Z860101 Personal history of adenomatous and serrated colon polyps: Secondary | ICD-10-CM | POA: Diagnosis not present

## 2024-04-10 DIAGNOSIS — Z860100 Personal history of colon polyps, unspecified: Secondary | ICD-10-CM

## 2024-04-10 SURGERY — COLONOSCOPY
Anesthesia: General

## 2024-04-10 MED ORDER — DEXMEDETOMIDINE HCL IN NACL 80 MCG/20ML IV SOLN
INTRAVENOUS | Status: DC | PRN
  Administered 2024-04-10: 12 ug via INTRAVENOUS
  Administered 2024-04-10: 8 ug via INTRAVENOUS

## 2024-04-10 MED ORDER — LIDOCAINE HCL (CARDIAC) PF 100 MG/5ML IV SOSY
PREFILLED_SYRINGE | INTRAVENOUS | Status: DC | PRN
  Administered 2024-04-10: 60 mg via INTRAVENOUS

## 2024-04-10 MED ORDER — LIDOCAINE HCL (PF) 2 % IJ SOLN
INTRAMUSCULAR | Status: AC
  Filled 2024-04-10: qty 5

## 2024-04-10 MED ORDER — PROPOFOL 10 MG/ML IV BOLUS
INTRAVENOUS | Status: DC | PRN
  Administered 2024-04-10 (×3): 50 mg via INTRAVENOUS

## 2024-04-10 MED ORDER — PROPOFOL 500 MG/50ML IV EMUL
INTRAVENOUS | Status: DC | PRN
Start: 2024-04-10 — End: 2024-04-10
  Administered 2024-04-10: 75 ug/kg/min via INTRAVENOUS

## 2024-04-10 MED ORDER — EPHEDRINE SULFATE-NACL 50-0.9 MG/10ML-% IV SOSY
PREFILLED_SYRINGE | INTRAVENOUS | Status: DC | PRN
  Administered 2024-04-10: 15 mg via INTRAVENOUS
  Administered 2024-04-10: 10 mg via INTRAVENOUS

## 2024-04-10 MED ORDER — SODIUM CHLORIDE 0.9 % IV SOLN: INTRAVENOUS | Status: DC

## 2024-04-10 NOTE — Anesthesia Postprocedure Evaluation (Signed)
 Anesthesia Post Note  Patient: Brittney Higgins  Procedure(s) Performed: COLONOSCOPY  Patient location during evaluation: Endoscopy Anesthesia Type: General Level of consciousness: awake and alert Pain management: pain level controlled Vital Signs Assessment: post-procedure vital signs reviewed and stable Respiratory status: spontaneous breathing, nonlabored ventilation and respiratory function stable Cardiovascular status: blood pressure returned to baseline and stable Postop Assessment: no apparent nausea or vomiting Anesthetic complications: no   No notable events documented.   Last Vitals:  Vitals:   04/10/24 0856 04/10/24 0904  BP: (!) 101/53 132/72  Pulse: 75 71  Resp: 19 13  Temp:    SpO2: 99% 100%    Last Pain:  Vitals:   04/10/24 0904  TempSrc:   PainSc: 0-No pain                 Fairy POUR Jaleigha Deane

## 2024-04-10 NOTE — Op Note (Signed)
 [facility] Gastroenterology Patient Name: Brittney Higgins Procedure Date: 04/10/2024 8:24 AM MRN: [id number] Account #: [id number] Date of Birth: Jul 09, 1970 Admit Type: Outpatient Age: 53 Room: [facility] ENDO ROOM 4 Gender: Female Note Status: Finalized Instrument Name: Colon Scope [id number] Procedure:             Colonoscopy Indications:           High risk colon cancer surveillance: Personal history                         of colonic polyps Providers:             Rogelia Copping MD, MD Referring MD:          Dorette FALCON. Sowles, MD (Referring MD) Medicines:             Propofol  per Anesthesia Complications:         No immediate complications. Procedure:             Pre-Anesthesia Assessment:                        - Prior to the procedure, a History and Physical was                         performed, and patient medications and allergies were                         reviewed. The patient's tolerance of previous                         anesthesia was also reviewed. The risks and benefits                         of the procedure and the sedation options and risks                         were discussed with the patient. All questions were                         answered, and informed consent was obtained. Prior                         Anticoagulants: The patient has taken no anticoagulant                         or antiplatelet agents. ASA Grade Assessment: II - A                         patient with mild systemic disease. After reviewing                         the risks and benefits, the patient was deemed in                         satisfactory condition to undergo the procedure.                        After obtaining informed consent, the colonoscope was  passed under direct vision. Throughout the procedure,                         the patient's blood pressure, pulse, and oxygen                         saturations were monitored continuously. The was                          introduced through the anus and advanced to the the                         cecum, identified by appendiceal orifice and ileocecal                         valve. The colonoscopy was performed without                         difficulty. The patient tolerated the procedure well.                         The quality of the bowel preparation was excellent. Findings:      The perianal and digital rectal examinations were normal.      Non-bleeding internal hemorrhoids were found during retroflexion. The       hemorrhoids were Grade I (internal hemorrhoids that do not prolapse). Impression:            - Non-bleeding internal hemorrhoids.                        - No specimens collected. Recommendation:        - Discharge patient to home.                        - Resume previous diet.                        - Continue present medications. Procedure Code(s):     --- Professional ---                        [phone], Colonoscopy, flexible; diagnostic, including                         collection of specimen(s) by brushing or washing, when                         performed (separate procedure) Diagnosis Code(s):     --- Professional ---                        Z86.010, Personal history of colonic polyps CPT copyright 2022 American Medical Association. All rights reserved. The codes documented in this report are preliminary and upon coder review may  be revised to meet current compliance requirements. Rogelia Copping MD, MD 04/10/2024 8:40:32 AM This report has been signed electronically. Number of Addenda: 0 Note Initiated On: 04/10/2024 8:24 AM Scope Withdrawal Time: 0 hours 7 minutes 37 seconds  Total Procedure Duration: 0 hours 10 minutes 47 seconds  Estimated Blood Loss:  Estimated blood loss: none.      Millen  [facility]

## 2024-04-10 NOTE — Anesthesia Preprocedure Evaluation (Signed)
 Anesthesia Evaluation  Patient identified by MRN, date of birth, ID band Patient awake    Reviewed: Allergy & Precautions, NPO status , Patient's Chart, lab work & pertinent test results  History of Anesthesia Complications Negative for: history of anesthetic complications  Airway Mallampati: III  TM Distance: >3 FB Neck ROM: full    Dental  (+) Chipped   Pulmonary neg pulmonary ROS, neg shortness of breath   Pulmonary exam normal        Cardiovascular (-) Past MI negative cardio ROS Normal cardiovascular exam     Neuro/Psych negative neurological ROS  negative psych ROS   GI/Hepatic negative GI ROS, Neg liver ROS,neg GERD  ,,  Endo/Other  negative endocrine ROSdiabetes, Type 2    Renal/GU negative Renal ROS  negative genitourinary   Musculoskeletal   Abdominal   Peds  Hematology negative hematology ROS (+)   Anesthesia Other Findings Past Medical History: 05/29/2010: Anemia     Comment:  3years of iron meds No date: Diabetes mellitus without complication (HCC) No date: Hyperlipidemia No date: Medical history non-contributory No date: Obesity 05/30/1999: Vaginal delivery  Past Surgical History: age 43: BREAST CYST ASPIRATION 02/18/2019: COLONOSCOPY WITH PROPOFOL ; N/A     Comment:  Procedure: COLONOSCOPY WITH PROPOFOL ;  Surgeon: Jinny Carmine, MD;  Location: [facility] ENDOSCOPY;  Service:               Endoscopy;  Laterality: N/A; 06/04/2013: DILITATION & CURRETTAGE/HYSTROSCOPY WITH NOVASURE ABLATION;  N/A     Comment:  Procedure: DILATATION & CURETTAGE/HYSTEROSCOPY WITH               NOVASURE ABLATION WITH ATTEMPTED VERSAPOINT;  Surgeon:               Charlie JINNY Flowers, MD;  Location: [facility];  Service:               Gynecology;  Laterality: N/A;     Reproductive/Obstetrics negative OB ROS                              Anesthesia Physical Anesthesia Plan  ASA:  3  Anesthesia Plan: General   Post-op Pain Management:    Induction: Intravenous  PONV Risk Score and Plan: Propofol  infusion and TIVA  Airway Management Planned: Natural Airway and Nasal Cannula  Additional Equipment:   Intra-op Plan:   Post-operative Plan:   Informed Consent: I have reviewed the patients History and Physical, chart, labs and discussed the procedure including the risks, benefits and alternatives for the proposed anesthesia with the patient or authorized representative who has indicated his/her understanding and acceptance.     Dental Advisory Given  Plan Discussed with: Anesthesiologist, CRNA and Surgeon  Anesthesia Plan Comments: (Patient consented for risks of anesthesia including but not limited to:  - adverse reactions to medications - risk of airway placement if required - damage to eyes, teeth, lips or other oral mucosa - nerve damage due to positioning  - sore throat or hoarseness - Damage to heart, brain, nerves, lungs, other parts of body or loss of life  Patient voiced understanding and assent.)        Anesthesia Quick Evaluation

## 2024-04-10 NOTE — H&P (Signed)
 Rogelia Copping, MD [facility] [address] [place], [place] [address] Phone:[phone] Fax : [phone]  Primary Care Physician:  Glenard Mire, MD Primary Gastroenterologist:  Dr. Copping  Pre-Procedure History & Physical: HPI:  Brittney Higgins is a 53 y.o. female is here for an colonoscopy.   Past Medical History:  Diagnosis Date   Anemia 05/29/2010   3years of iron meds   Diabetes mellitus without complication (HCC)    Hyperlipidemia    Medical history non-contributory    Obesity    Vaginal delivery 05/30/1999    Past Surgical History:  Procedure Laterality Date   BREAST CYST ASPIRATION  age 84   COLONOSCOPY WITH PROPOFOL  N/A 02/18/2019   Procedure: COLONOSCOPY WITH PROPOFOL ;  Surgeon: Copping Rogelia, MD;  Location: [facility] END[facility];  Service: Endoscopy;  Laterality: N/A;   DILITATION & CURRETTAGE/HYSTROSCOPY WITH NOVASURE ABLATION N/A 06/04/2013   Procedure: DILATATION & CURETTAGE/HYSTEROSCOPY WITH NOVASURE ABLATION WITH ATTEMPTED VERSAPOINT;  Surgeon: Charlie JINNY Flowers, MD;  Location: [facility];  Service: Gynecology;  Laterality: N/A;    Prior to Admission medications   Medication Sig Start Date End Date Taking? Authorizing Provider  clobetasol  (OLUX ) 0.05 % topical foam Apply topically 2 (two) times daily. 06/28/23   Sowles, Krichna, MD  Fluocinolone  Acetonide Scalp 0.01 % OIL APPLY TO SCALP DAILY AS NEEDED 06/28/23   Sowles, Krichna, MD  ketoconazole  (NIZORAL ) 2 % shampoo Once weekly, lather on scalp, leave on 5 minutes, rinse well. 06/28/23   Glenard, Krichna, MD  rosuvastatin  (CRESTOR ) 10 MG tablet Take 1 tablet (10 mg total) by mouth daily. Patient not taking: Reported on 03/14/2024 06/28/23   Sowles, Krichna, MD  Selenium  Sulfide 2.3 % SHAM Apply 5 mLs topically 3 (three) times a week. 06/29/23   Sowles, Krichna, MD  triamcinolone  cream (KENALOG ) 0.1 % Apply 1 Application topically 2 (two) times daily. 05/15/22   Sowles, Krichna, MD  Vitamin D , Ergocalciferol , (DRISDOL ) 1.25 MG  (50000 UNIT) CAPS capsule TAKE 1 CAPSULE (50,000 UNITS TOTAL) BY MOUTH EVERY 14 DAYS 03/24/24   Sowles, Krichna, MD    Allergies as of 12/27/2023   (No Known Allergies)    Family History  Problem Relation Age of Onset   Hypertension Mother    COPD Mother    Colon polyps Father    Heart disease Father    CAD Father    AAA (abdominal aortic aneurysm) Father    Diabetes Father    Dementia Father    Other Father        Kidney Failure   Hypertension Sister    Diabetes Sister    Sleep apnea Sister    Asthma Son    Diabetes Sister    Hypertension Sister    Diabetes Sister    Hypertension Sister    Breast cancer Neg Hx     Social History   Socioeconomic History   Marital status: Married    Spouse name: Alm   Number of children: 1   Years of education: Not on file   Highest education level: Master's degree (e.g., MA, MS, MEng, MEd, MSW, MBA)  Occupational History   Occupation: assistance principal     Employer: [employer]    Comment: [employer]  Tobacco Use   Smoking status: Never    Passive exposure: Never   Smokeless tobacco: Never  Vaping Use   Vaping status: Never Used  Substance and Sexual Activity   Alcohol use: No    Alcohol/week: 0.0 standard drinks of alcohol  Drug use: No   Sexual activity: Yes    Partners: Male    Birth control/protection: None  Other Topics Concern   Not on file  Social History Narrative   Not on file   Social Drivers of Health   Financial Resource Strain: Low Risk  (03/12/2024)   Received from [facility]   Overall Financial Resource Strain (CARDIA)    Difficulty of Paying Living Expenses: Not hard at all  Food Insecurity: No Food Insecurity (03/12/2024)   Received from [facility]   Hunger Vital Sign    Within the past 12 months, you worried that your food would run out before you got the money to buy more.: Never true    Within the past 12 months, the food you bought just didn't last and  you didn't have money to get more.: Never true  Transportation Needs: No Transportation Needs (03/12/2024)   Received from [facility] - Transportation    In the past 12 months, has lack of transportation kept you from medical appointments or from getting medications?: No    Lack of Transportation (Non-Medical): No  Physical Activity: Inactive (03/14/2024)   Exercise Vital Sign    Days of Exercise per Week: 0 days    Minutes of Exercise per Session: 0 min  Stress: Stress Concern Present (03/14/2024)   [employer] of Occupational [employer] - Occupational Stress Questionnaire    Feeling of Stress: Rather much  Social Connections: Moderately Integrated (03/14/2024)   Social Connection and Isolation Panel    Frequency of Communication with Friends and Family: More than three times a week    Frequency of Social Gatherings with Friends and Family: Twice a week    Attends Religious Services: More than 4 times per year    Active Member of [employer] or Organizations: No    Attends Banker Meetings: Never    Marital Status: Married  Catering Manager Violence: Not At Risk (03/14/2024)   Humiliation, Afraid, Rape, and Kick questionnaire    Fear of Current or Ex-Partner: No    Emotionally Abused: No    Physically Abused: No    Sexually Abused: No    Review of Systems: See HPI, otherwise negative ROS  Physical Exam: BP (!) 152/76   Pulse 72   Temp (!) 96.5 F (35.8 C) (Temporal)   Resp 18   Ht 5' 4 (1.626 m)   Wt 82.3 kg   SpO2 100%   BMI 31.14 kg/m  General:   Alert,  pleasant and cooperative in NAD Head:  Normocephalic and atraumatic. Neck:  Supple; no masses or thyromegaly. Lungs:  Clear throughout to auscultation.    Heart:  Regular rate and rhythm. Abdomen:  Soft, nontender and nondistended. Normal bowel sounds, without guarding, and without rebound.   Neurologic:  Alert and  oriented x4;  grossly normal  neurologically.  Impression/Plan: Brittney Higgins is here for an colonoscopy to be performed for a history of adenomatous polyps on 2020   Risks, benefits, limitations, and alternatives regarding  colonoscopy have been reviewed with the patient.  Questions have been answered.  All parties agreeable.   Rogelia Copping, MD  04/10/2024, 8:14 AM

## 2024-04-10 NOTE — Transfer of Care (Signed)
 Immediate Anesthesia Transfer of Care Note  Patient: Brittney Higgins  Procedure(s) Performed: COLONOSCOPY  Patient Location: PACU  Anesthesia Type:General  Level of Consciousness: sedated  Airway & Oxygen Therapy: Patient Spontanous Breathing  Post-op Assessment: Report given to RN and Post -op Vital signs reviewed and stable  Post vital signs: Reviewed and stable  Last Vitals:  Vitals Value Taken Time  BP 117/52 04/10/24 08:43  Temp    Pulse 80 04/10/24 08:44  Resp 21 04/10/24 08:44  SpO2 98 % 04/10/24 08:44  Vitals shown include unfiled device data.  Last Pain:  Vitals:   04/10/24 0807  TempSrc: Temporal  PainSc: 0-No pain         Complications: No notable events documented.

## 2024-04-11 NOTE — Telephone Encounter (Signed)
 Requested medications are due for refill today.  unsure  Requested medications are on the active medications list.  yes  Last refill. 06/28/2023 50g 1 rf  Future visit scheduled.   yes  Notes to clinic.  Refill not delegated.    Requested Prescriptions  Pending Prescriptions Disp Refills   clobetasol  (OLUX ) 0.05 % topical foam [Pharmacy Med Name: CLOBETASOL  PROP 0.05% FOAM] 50 g 1    Sig: APPLY TOPICALLY TWICE A DAY     Not Delegated - Dermatology:  Corticosteroids Failed - 04/11/2024  5:32 PM      Failed - This refill cannot be delegated      Passed - Valid encounter within last 12 months    Recent Outpatient Visits           4 weeks ago Well adult exam   [facility] Glenard Mire, MD   3 months ago Dyslipidemia associated with type 2 diabetes mellitus [facility])   [facility] Sowles, Krichna, MD

## 2024-05-01 ENCOUNTER — Ambulatory Visit: Admitting: Family Medicine

## 2024-05-01 ENCOUNTER — Encounter: Payer: Self-pay | Admitting: Family Medicine

## 2024-05-01 VITALS — BP 132/84 | HR 84 | Resp 18 | Ht 64.0 in | Wt 191.0 lb

## 2024-05-01 DIAGNOSIS — D563 Thalassemia minor: Secondary | ICD-10-CM

## 2024-05-01 DIAGNOSIS — R809 Proteinuria, unspecified: Secondary | ICD-10-CM | POA: Diagnosis not present

## 2024-05-01 DIAGNOSIS — Z13820 Encounter for screening for osteoporosis: Secondary | ICD-10-CM

## 2024-05-01 DIAGNOSIS — N951 Menopausal and female climacteric states: Secondary | ICD-10-CM | POA: Insufficient documentation

## 2024-05-01 DIAGNOSIS — G4762 Sleep related leg cramps: Secondary | ICD-10-CM

## 2024-05-01 DIAGNOSIS — E1169 Type 2 diabetes mellitus with other specified complication: Secondary | ICD-10-CM | POA: Diagnosis not present

## 2024-05-01 DIAGNOSIS — M2560 Stiffness of unspecified joint, not elsewhere classified: Secondary | ICD-10-CM | POA: Diagnosis not present

## 2024-05-01 DIAGNOSIS — E1129 Type 2 diabetes mellitus with other diabetic kidney complication: Secondary | ICD-10-CM

## 2024-05-01 DIAGNOSIS — L6681 Central centrifugal cicatricial alopecia: Secondary | ICD-10-CM

## 2024-05-01 DIAGNOSIS — L219 Seborrheic dermatitis, unspecified: Secondary | ICD-10-CM

## 2024-05-01 DIAGNOSIS — E785 Hyperlipidemia, unspecified: Secondary | ICD-10-CM | POA: Diagnosis not present

## 2024-05-01 LAB — POCT GLYCOSYLATED HEMOGLOBIN (HGB A1C): Hemoglobin A1C: 6.7 % — AB (ref 4.0–5.6)

## 2024-05-01 MED ORDER — KETOCONAZOLE 2 % EX SHAM: MEDICATED_SHAMPOO | CUTANEOUS | 3 refills | Status: AC

## 2024-05-01 MED ORDER — FLUOCINOLONE ACETONIDE SCALP 0.01 % EX OIL: TOPICAL_OIL | CUTANEOUS | 1 refills | Status: AC

## 2024-05-01 MED ORDER — SELENIUM SULFIDE 2.3 % EX SHAM: 5.0000 mL | MEDICATED_SHAMPOO | CUTANEOUS | 2 refills | Status: AC

## 2024-05-01 MED ORDER — ROSUVASTATIN CALCIUM 10 MG PO TABS: 10.0000 mg | ORAL_TABLET | Freq: Every day | ORAL | 1 refills | Status: AC

## 2024-05-01 NOTE — Progress Notes (Signed)
 Name: Brittney Higgins   MRN: [id number]    DOB: 09-27-1970   Date:05/01/2024       Progress Note  Subjective  Chief Complaint  Chief Complaint  Patient presents with   Medical Management of Chronic Issues    Discuss/review last labs   Diabetes   Hyperlipidemia    Never picked up chol med   Discussed the use of AI scribe software for clinical note transcription with the patient, who gave verbal consent to proceed.  History of Present Illness Brittney Higgins is a 53 year old female with type 2 diabetes and dyslipidemia who presents with concerns about elevated protein levels and joint pain.  She is concerned about elevated total protein levels at 8.7, slightly above the normal range of 8.1. She has noticed bubbles in her urine, which she read could indicate proteinuria, but her urine microalbumin test was negative.  She experiences persistent hip pain despite receiving injections, which only provided relief for about one to two weeks. The pain worsened after using a hot tub, and she experiences significant cramps at night, particularly behind her knees and in her big toe. She describes stiffness when getting up after sitting for a while, which is not limited to the morning. She has not seen a rheumatologist and is currently only seeing an orthopedic specialist. She has not been exercising due to hip pain, which may be affecting her diabetes management.  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 She describes breaking out in a sweat during these episodes.  She reports feeling generally unwell at times, with symptoms including an upset stomach and a sensation of not feeling like herself. No reflux, heartburn, or indigestion. She also mentioned a past episode of wheezing when lying on her right side, which has since resolved.  She has type 2 diabetes, diagnosed in 2022,  with an A1c of 6.7. No symptoms of excessive hunger, thirst, or increased urination.  She has dyslipidemia, with low HDL, high triglycerides, and high LDL cholesterol. She is not currently taking any medication for this condition.  She experiences alopecia and seborrheic dermatitis, for which she uses topical treatments. She is currently out of most of her medications except for a foam treatment.  She experiences symptoms such as hot flashes and cramps. She has a history of iron deficiency and alpha thalassemia minor, which affects her red blood cell size. Her recent blood work showed slightly elevated RDW, but her iron storage levels are being monitored.    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   Alpha thalassemia minor 02/10/2021   Polyp of sigmoid colon    Family history of colonic polyps 02/14/2019   Seborrheic dermatitis of scalp 01/08/2015   Lipoma of arm 01/08/2015   History of anemia 01/01/2015   Dyslipidemia 01/01/2015   Obesity (BMI 30.0-34.9) 01/01/2015   Vitamin D  deficiency 01/01/2015   Chronic constipation 01/01/2015   Fibroid 01/01/2015   Bunion 01/01/2015    Past Surgical History:  Procedure Laterality Date   BREAST CYST ASPIRATION  age 39   COLONOSCOPY N/A 04/10/2024   Procedure: COLONOSCOPY;  Surgeon:  Jinny Carmine, MD;  Location: [facility];  Service: Endoscopy;  Laterality: N/A;   COLONOSCOPY WITH PROPOFOL  N/A 02/18/2019   Procedure: COLONOSCOPY WITH PROPOFOL ;  Surgeon: Jinny Carmine, MD;  Location: [facility];  Service: Endoscopy;  Laterality: N/A;   DILITATION & CURRETTAGE/HYSTROSCOPY WITH NOVASURE ABLATION N/A 06/04/2013    Procedure: DILATATION & CURETTAGE/HYSTEROSCOPY WITH NOVASURE ABLATION WITH ATTEMPTED VERSAPOINT;  Surgeon: Charlie JINNY Flowers, MD;  Location: [facility];  Service: Gynecology;  Laterality: N/A;    Family History  Problem Relation Age of Onset   Hypertension Mother    COPD Mother    Colon polyps Father    Heart disease Father    CAD Father    AAA (abdominal aortic aneurysm) Father    Diabetes Father    Dementia Father    Other Father        Kidney Failure   Hypertension Sister    Diabetes Sister    Sleep apnea Sister    Asthma Son    Diabetes Sister    Hypertension Sister    Diabetes Sister    Hypertension Sister    Breast cancer Neg Hx     Social History   Tobacco Use   Smoking status: Never    Passive exposure: Never   Smokeless tobacco: Never  Substance Use Topics   Alcohol use: No    Alcohol/week: 0.0 standard drinks of alcohol     Current Outpatient Medications:    clobetasol  (OLUX ) 0.05 % topical foam, APPLY TOPICALLY TWICE A DAY, Disp: 50 g, Rfl: 1   Fluocinolone  Acetonide Scalp 0.01 % OIL, APPLY TO SCALP DAILY AS NEEDED, Disp: 118.28 mL, Rfl: 1   ketoconazole  (NIZORAL ) 2 % shampoo, Once weekly, lather on scalp, leave on 5 minutes, rinse well., Disp: 120 mL, Rfl: 3   Selenium  Sulfide 2.3 % SHAM, Apply 5 mLs topically 3 (three) times a week., Disp: 180 mL, Rfl: 2   triamcinolone  cream (KENALOG ) 0.1 %, Apply 1 Application topically 2 (two) times daily., Disp: 453.6 g, Rfl: 0   Vitamin D , Ergocalciferol , (DRISDOL ) 1.25 MG (50000 UNIT) CAPS capsule, TAKE 1 CAPSULE (50,000 UNITS TOTAL) BY MOUTH EVERY 14 DAYS, Disp: 6 capsule, Rfl: 1   rosuvastatin  (CRESTOR ) 10 MG tablet, Take 1 tablet (10 mg total) by mouth daily. (Patient not taking: Reported on 05/01/2024), Disp: 90 tablet, Rfl: 1  No Known Allergies  I personally reviewed active problem list, medication list, allergies, family history with the patient/caregiver today.   ROS  Ten systems reviewed and is negative  except as mentioned in HPI    Objective Physical Exam CONSTITUTIONAL: Patient appears well-developed and well-nourished.  No distress. HEENT: Head atraumatic, normocephalic, neck supple. CARDIOVASCULAR: Normal rate, regular rhythm and normal heart sounds.  No murmur heard. No BLE edema. PULMONARY: Effort normal and breath sounds normal. No respiratory distress. ABDOMINAL: There is no tenderness or distention. MUSCULOSKELETAL: Normal gait. Without gross motor or sensory deficit. PSYCHIATRIC: Patient has a normal mood and affect. behavior is normal. Judgment and thought content normal.  Vitals:   05/01/24 0958  BP: 132/84  Pulse: 84  Resp: 18  SpO2: 99%  Weight: 191 lb (86.6 kg)  Height: 5' 4 (1.626 m)    Body mass index is 32.79 kg/m.  Recent Results (from the past 2160 hours)  Urine Microalbumin w/creat. ratio     Status: None   Collection Time: 03/20/24  9:13 AM  Result Value Ref Range   Creatinine, Urine 40 20 - 275 mg/dL  Microalb, Ur 0.9 mg/dL    Comment: Reference Range Not established    Microalb Creat Ratio 23 <30 mg/g creat    Comment: . The ADA defines abnormalities in albumin excretion as follows: SABRA Albuminuria Category        Result (mg/g creatinine) . Normal to Mildly increased   <30 Moderately increased         30-299  Severely increased           > OR = 300 . The ADA recommends that at least two of three specimens collected within a 3-6 month period be abnormal before considering a patient to be within a diagnostic category.   Comprehensive metabolic panel with GFR     Status: Abnormal   Collection Time: 03/20/24  9:13 AM  Result Value Ref Range   Glucose, Bld 123 (H) 65 - 99 mg/dL    Comment: .            Fasting reference interval . For someone without known diabetes, a glucose value between 100 and 125 mg/dL is consistent with prediabetes and should be confirmed with a follow-up test. .    BUN 15 7 - 25 mg/dL   Creat 9.15 9.49 - 8.96  mg/dL   eGFR 83 > OR = 60 fO/fpw/8.26f7   BUN/Creatinine Ratio SEE NOTE: 6 - 22 (calc)    Comment:    Not Reported: BUN and Creatinine are within    reference range. .    Sodium 138 135 - 146 mmol/L   Potassium 3.7 3.5 - 5.3 mmol/L   Chloride 100 98 - 110 mmol/L   CO2 25 20 - 32 mmol/L   Calcium  9.4 8.6 - 10.4 mg/dL   Total Protein 8.7 (H) 6.1 - 8.1 g/dL   Albumin 5.0 3.6 - 5.1 g/dL   Globulin 3.7 1.9 - 3.7 g/dL (calc)   AG Ratio 1.4 1.0 - 2.5 (calc)   Total Bilirubin 0.7 0.2 - 1.2 mg/dL   Alkaline phosphatase (APISO) 59 37 - 153 U/L   AST 21 10 - 35 U/L   ALT 24 6 - 29 U/L  CBC with Differential/Platelet     Status: Abnormal   Collection Time: 03/20/24  9:13 AM  Result Value Ref Range   WBC 6.9 3.8 - 10.8 Thousand/uL   RBC 6.36 (H) 3.80 - 5.10 Million/uL   Hemoglobin 14.1 11.7 - 15.5 g/dL   HCT 55.0 64.9 - 54.9 %   MCV 70.6 (L) 80.0 - 100.0 fL   MCH 22.2 (L) 27.0 - 33.0 pg   MCHC 31.4 (L) 32.0 - 36.0 g/dL    Comment: For adults, a slight decrease in the calculated MCHC value (in the range of 30 to 32 g/dL) is most likely not clinically significant; however, it should be interpreted with caution in correlation with other red cell parameters and the patient's clinical condition.    RDW 15.1 (H) 11.0 - 15.0 %   Platelets 284 140 - 400 Thousand/uL   MPV 12.2 7.5 - 12.5 fL   Neutro Abs 2,781 1,500 - 7,800 cells/uL   Absolute Lymphocytes 3,229 850 - 3,900 cells/uL   Absolute Monocytes 380 200 - 950 cells/uL   Eosinophils Absolute 449 15 - 500 cells/uL   Basophils Absolute 62 0 - 200 cells/uL   Neutrophils Relative % 40.3 %   Total Lymphocyte 46.8 %   Monocytes Relative 5.5 %   Eosinophils Relative 6.5 %   Basophils Relative 0.9 %  Lipid panel  Status: Abnormal   Collection Time: 03/20/24  9:13 AM  Result Value Ref Range   Cholesterol 239 (H) <200 mg/dL   HDL 46 (L) > OR = 50 mg/dL   Triglycerides 781 (H) <150 mg/dL    Comment: . If a non-fasting specimen was  collected, consider repeat triglyceride testing on a fasting specimen if clinically indicated.  Veatrice et al. J. of Clin. Lipidol. 2015;9:129-169. SABRA    LDL Cholesterol (Calc) 154 (H) mg/dL (calc)    Comment: Reference range: <100 . Desirable range <100 mg/dL for primary prevention;   <70 mg/dL for patients with CHD or diabetic patients  with > or = 2 CHD risk factors. SABRA LDL-C is now calculated using the Martin-Hopkins  calculation, which is a validated novel method providing  better accuracy than the Friedewald equation in the  estimation of LDL-C.  Gladis APPLETHWAITE et al. SANDREA. 7986;689(80): 2061-2068  (http://education.[facility].com/faq/FAQ164)    Total CHOL/HDL Ratio 5.2 (H) <5.0 (calc)   Non-HDL Cholesterol (Calc) 193 (H) <130 mg/dL (calc)    Comment: For patients with diabetes plus 1 major ASCVD risk  factor, treating to a non-HDL-C goal of <100 mg/dL  (LDL-C of <29 mg/dL) is considered a therapeutic  option.   VITAMIN D  25 Hydroxy (Vit-D Deficiency, Fractures)     Status: None   Collection Time: 03/20/24  9:13 AM  Result Value Ref Range   Vit D, 25-Hydroxy 41 30 - 100 ng/mL    Comment: Vitamin D  Status         25-OH Vitamin D : . Deficiency:                    <20 ng/mL Insufficiency:             20 - 29 ng/mL Optimal:                 > or = 30 ng/mL . For 25-OH Vitamin D  testing on patients on  D2-supplementation and patients for whom quantitation  of D2 and D3 fractions is required, the Quest[facility]ureD(TM) 25-OH VIT D, (D2,D3), LC/MS/MS is recommended: order  code 07111 (patients >64yrs). . See Note 1 . Note 1 . For additional information, please refer to  http://education.[facility].com/faq/FAQ199  (This link is being provided for informational/ educational purposes only.)   FSH/LH     Status: None   Collection Time: 03/20/24  9:13 AM  Result Value Ref Range   FSH 49.4 mIU/mL    Comment:                     Reference Range .               Follicular Phase       2.5-10.2              Mid-cycle Peak         3.1-17.7              Luteal Phase           1.5- 9.1              Postmenopausal       23.0-116.3              .    LH 29.9 mIU/mL    Comment:     Reference Range Follicular Phase  1.9-12.5 Mid-Cycle Peak    8.7-76.3 Luteal Phase      0.5-16.9 Postmenopausal    10.0-54.7   POCT  glycosylated hemoglobin (Hb A1C)     Status: Abnormal   Collection Time: 05/01/24 10:09 AM  Result Value Ref Range   Hemoglobin A1C 6.7 (A) 4.0 - 5.6 %   HbA1c POC (<> result, manual entry)     HbA1c, POC (prediabetic range)     HbA1c, POC (controlled diabetic range)      PHQ2/9:    05/01/2024    9:58 AM 03/14/2024    8:14 AM 06/28/2023    8:43 AM 03/29/2023    8:54 AM 03/12/2023    1:23 PM  Depression screen PHQ 2/9  Decreased Interest 0 0 0 0 0  Down, Depressed, Hopeless 0 0 0 0 0  PHQ - 2 Score 0 0 0 0 0  Altered sleeping 0  0 0 0  Tired, decreased energy 0  0 0 0  Change in appetite 0  0 0 0  Feeling bad or failure about yourself  0  0 0 0  Trouble concentrating 0  0 0 0  Moving slowly or fidgety/restless 0  0 0 0  Suicidal thoughts 0  0 0 0  PHQ-9 Score 0  0  0  0   Difficult doing work/chores Not difficult at all  Not difficult at all       Data saved with a previous flowsheet row definition    phq 9 is negative  Fall Risk:    05/01/2024    9:58 AM 03/14/2024    8:13 AM 06/28/2023    8:43 AM 03/29/2023    8:54 AM 03/12/2023    1:23 PM  Fall Risk   Falls in the past year? 0 0 0 0 0  Number falls in past yr: 0 0 0  0  Injury with Fall? 0 0  0   0   Risk for fall due to :  No Fall Risks No Fall Risks No Fall Risks No Fall Risks  Follow up Falls evaluation completed Falls evaluation completed Falls prevention discussed;Education provided;Falls evaluation completed Falls prevention discussed Falls prevention discussed     Data saved with a previous flowsheet row definition      Assessment & Plan Chronic  bilateral hip pain and stiffness Chronic bilateral hip pain and stiffness, possibly due to mild hip arthritis. Previous injections provided short-term relief. Differential includes inflammatory or autoimmune conditions. - Ordered inflammatory panel and autoimmune panel. - Referred to rheumatologist for further evaluation.  Sleep-related leg cramps Experiencing major cramps at night, possibly related to iron deficiency or electrolyte imbalances. - Ordered magnesium and iron storage tests.  Postmenopausal state with vasomotor symptoms (hot flashes) Postmenopausal with significant vasomotor symptoms. Discussed HRT benefits and risks, including minimal risk of heart attack and breast cancer, and benefits such as decreased risk of colon cancer and osteoporosis. - Discussed hormone replacement therapy options, including Prempro or Climara patch with progesterone.  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 A1c is 6.7. Discussed benefits of maintaining A1c close to normal. - Encouraged dietary management and exercise as tolerated.  Dyslipidemia associated with type 2 DM Low HDL, elevated triglycerides, and high LDL. Discussed importance of managing cholesterol levels, especially with diabetes. - Prescribed rosuvastatin , instructed to take at night. - Instructed to monitor for muscle pain as a side effect.  Central centrifugal cicatricial alopecia Worsening central centrifugal cicatricial alopecia. Current treatment includes topical medications. - Prescribed fluocinonide oil and ketoconazole  shampoo. - Advised to use selenium  sulfide  shampoo as needed.  Seborrheic dermatitis of scalp Seborrheic dermatitis of the scalp, managed with topical treatments. - Prescribed fluocinonide oil and ketoconazole  shampoo. - Advised to use selenium  sulfide shampoo as needed.  Alpha thalassemia minor Alpha thalassemia minor with  no current anemia. - Continue monitoring blood work for any changes.  General Health Maintenance Discussed bone density screening due to postmenopausal status. Mammogram scheduled for January 29th. - Ordered bone density test. - Advised to check with insurance regarding coverage for bone density test.

## 2024-05-02 ENCOUNTER — Ambulatory Visit: Payer: Self-pay | Admitting: Family Medicine

## 2024-05-06 LAB — ANALYZER(R)ANA IFA WITH REFLEX TITER/PATTRN,SYS AUTOIMM PNL1
Anti Nuclear Antibody (ANA): NEGATIVE
Anticardiolipin IgA: <2 [APL'U]/mL
Anticardiolipin IgG: <2 [GPL'U]/mL
Anticardiolipin IgM: <2 [MPL'U]/mL
Beta-2 Glyco 1 IgA: <2 U/mL
Beta-2 Glyco 1 IgM: <2 U/mL
Beta-2 Glyco I IgG: <2 U/mL
C3 Complement: 175 mg/dL (ref 83–193)
C4 Complement: 42 mg/dL (ref 15–57)
Centromere Ab Screen: <1 AI
Chromatin (Nucleosomal) Antibody: <1 AI
Cyclic Citrullin Peptide Ab: <16 U
DNA Ab (DS) Crithidia, IFA: NEGATIVE
ENA SM Ab Ser-aCnc: <1 AI
Jo-1 Autoabs: <1 AI
MUTATED CITRULLINATED VIMENTIN (MCV) AB: <20 U/mL (ref <20)
Rheumatoid Factor (IgA): <5 U
Rheumatoid Factor (IgG): <5 U
Rheumatoid Factor (IgM): <5 U
Ribonucleic Protein(ENA) Antibody, IgG: <1 AI
SM/RNP: <1 AI
SSA (Ro) (ENA) Antibody, IgG: <1 AI
SSB (La) (ENA) Antibody, IgG: <1 AI
Scleroderma (Scl-70) (ENA) Antibody, IgG: <1 AI
Thyroperoxidase Ab SerPl-aCnc: 2 [IU]/mL (ref <9)

## 2024-05-06 LAB — FERRITIN: Ferritin: 102 ng/mL (ref 16–232)

## 2024-05-06 LAB — C-REACTIVE PROTEIN: CRP: 5.1 mg/L (ref <8.0)

## 2024-05-06 LAB — MAGNESIUM: Magnesium: 2 mg/dL (ref 1.5–2.5)

## 2024-05-06 LAB — SEDIMENTATION RATE: Sed Rate: 11 mm/h (ref 0–30)

## 2024-05-06 LAB — TSH: TSH: 1 m[IU]/L

## 2024-06-25 ENCOUNTER — Ambulatory Visit: Admitting: Family Medicine

## 2024-06-25 ENCOUNTER — Encounter: Payer: Self-pay | Admitting: Family Medicine

## 2024-06-25 VITALS — BP 134/82 | HR 61 | Resp 16 | Ht 64.0 in | Wt 193.9 lb

## 2024-06-25 DIAGNOSIS — B308 Other viral conjunctivitis: Secondary | ICD-10-CM

## 2024-06-25 MED ORDER — CIPROFLOXACIN HCL 0.3 % OP SOLN: 2.0000 [drp] | OPHTHALMIC | 0 refills | Status: AC

## 2024-06-25 NOTE — Progress Notes (Signed)
 Name: Brittney Higgins   MRN: [id number]    DOB: 02/25/1971   Date:06/25/2024       Progress Note  Subjective  Chief Complaint  Chief Complaint  Patient presents with   Eye Problem    Last 8 days R eye crusty, itchy and red in the morning stopped wearing contacts, no pain    Discussed the use of AI scribe software for clinical note transcription with the patient, who gave verbal consent to proceed.  History of Present Illness Brittney Higgins is a 54 year old female who presents with redness and crusting of the right eye.  Symptoms began on January 19th, approximately ten days ago, affecting the right eye consistently, with the left eye experiencing symptoms only once. In the mornings, the right eye is notably red and crusty, requiring a warm compress to open. Symptoms improve throughout the day but recur after naps.  She has been using artificial tear drops, but there has been no improvement. No pain, runny nose, congestion, headaches, or vision issues are reported, even when the eye is red in the morning.  She wears contact lenses, replaced monthly, but has not used them since the symptoms began. She is unsure of the exact type of lenses. Her last eye exam was up to date, though the exact date is not recalled.    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   Alpha thalassemia minor 02/10/2021   Polyp of sigmoid colon    Family history of colonic polyps 02/14/2019   Seborrheic dermatitis of scalp 01/08/2015   Lipoma of arm 01/08/2015   History of anemia 01/01/2015   Dyslipidemia 01/01/2015   Obesity (BMI 30.0-34.9) 01/01/2015   Vitamin D  deficiency 01/01/2015   Chronic  constipation 01/01/2015   Fibroid 01/01/2015   Bunion 01/01/2015    Social History   Tobacco Use   Smoking status: Never    Passive exposure: Never   Smokeless tobacco: Never  Substance Use Topics   Alcohol use: No    Alcohol/week: 0.0 standard drinks of alcohol    Current Medications[1]  Allergies[2]  ROS  Ten systems reviewed and is negative except as mentioned in HPI    Objective  Vitals:   06/25/24 1252  BP: 134/82  Pulse: 61  Resp: 16  SpO2: 93%  Weight: 193 lb 14.4 oz (88 kg)  Height: 5' 4 (1.626 m)    Body mass index is 33.28 kg/m.    Physical Exam CONSTITUTIONAL: Patient appears well-developed and well-nourished. No distress. HEENT: Head atraumatic, normocephalic, neck supple. Injected conjunctiva on right eye only , no redness around iris. CARDIOVASCULAR: Normal rate, regular rhythm and normal heart sounds. No murmur heard. No BLE edema. PULMONARY: Effort normal and breath sounds normal. Lungs clear to auscultation. No respiratory distress. ABDOMINAL: There is no tenderness or distention. PSYCHIATRIC: Patient has a normal mood and affect. Behavior is normal. Judgment and thought content normal.  Vision Screening   Right eye Left eye Both eyes  Without correction     With correction 20/20 20/20 20/15      Recent Results (from the past 2160 hours)  POCT glycosylated hemoglobin (Hb A1C)     Status: Abnormal   Collection Time:  05/01/24 10:09 AM  Result Value Ref Range   Hemoglobin A1C 6.7 (A) 4.0 - 5.6 %   HbA1c POC (<> result, manual entry)     HbA1c, POC (prediabetic range)     HbA1c, POC (controlled diabetic range)    Analyzer ANA IFA w/RFLX Titer/Pattern,Systemic Autoimmune Panel 1     Status: None   Collection Time: 05/01/24 11:09 AM  Result Value Ref Range   Anti Nuclear Antibody (ANA) NEGATIVE NEGATIVE    Comment: ANA IFA is a first line screen for detecting the presence of up to approximately 150 autoantibodies in various  autoimmune diseases. A negative ANA IFA result suggests an ANA-associated autoimmune disease is not present at this time, but is not definitive. If there is high clinical suspicion for Sjogren's syndrome, testing for anti-SS-A/Ro antibody should be considered. Anti-Jo-1 antibody should be considered for clinically suspected inflammatory myopathies. . AC-0: Negative International Consensus on ANA Patterns severties.uy . For additional information, please refer to http://education.[facility].com/faq/FAQ177 (This link is being provided for informational/educational purposes only.)    DNA Ab (DS) Crithidia, IFA NEGATIVE NEGATIVE   Chromatin (Nucleosomal) Antibody <1.0 NEG <1.0 NEGATIVE AI   ENA SM Ab Ser-aCnc <1.0 NEG <1.0 NEGATIVE AI   SM/RNP <1.0 NEG <1.0 NEGATIVE AI   Ribonucleic Protein(ENA) Antibody, IgG <1.0 NEG <1.0 NEGATIVE AI   SSA (Ro) (ENA) Antibody, IgG <1.0 NEG <1.0 NEGATIVE AI   SSB (La) (ENA) Antibody, IgG <1.0 NEG <1.0 NEGATIVE AI   Scleroderma (Scl-70) (ENA) Antibody, IgG <1.0 NEG <1.0 NEGATIVE AI   Jo-1 Autoabs <1.0 NEG <1.0 NEGATIVE AI   Centromere Ab Screen <1.0 NEG <1.0 NEGATIVE AI   C3 Complement 175 83 - 193 mg/dL   C4 Complement 42 15 - 57 mg/dL   Anticardiolipin IgA <7.9 APL-U/mL    Comment: . SABRA Value        Interpretation -----        -------------- <20.0        Antibody not detected > or = 20.0  Antibody detected .    Anticardiolipin IgG <2.0 GPL-U/mL    Comment: . SABRA Value        Interpretation -----        -------------- <20.0        Antibody not detected > or = 20.0  Antibody detected .    Anticardiolipin IgM <2.0 MPL-U/mL    Comment: . SABRA Value        Interpretation -----        -------------- <20.0        Antibody not detected > or = 20.0  Antibody detected .    Beta-2 Glyco 1 IgA <2.0 U/mL    Comment: . Value        Interpretation -----        -------------- <20.0        Antibody not detected > or  = 20.0  Antibody detected .    Beta-2 Glyco I IgG <2.0 U/mL    Comment: . Value        Interpretation -----        -------------- <20.0        Antibody not detected > or = 20.0  Antibody detected .    Beta-2 Glyco 1 IgM <2.0 U/mL    Comment: The antiphospholipid antibody syndrome (APS) is a clinical-pathologic correlation that includes a clinical event (e.g. arterial or venous thrombosis, pregnancy morbidity) and persistent positive antiphospholipid antibodies (IgM, IgG Cardiolipin or b2GPI antibodies greater than  the 99th percentile; or a lupus anticoagulant). International consensus guidelines for APS suggest waiting at least 12 weeks before retesting to confirm antibody persistence. The Systemic Lupus [facility] immunological classification criteria for systemic lupus erythematosus (SLE) include testing for isotype IgA, which has yet to be incorporated into APS criteria. Low level antiphospholipid antibodies may sometimes be detected in the setting of infection, drug therapy or aging. . For additional information, please refer to http://education.[facility].com/faq/FAQ109 (This link is being provided for informational/educational purposes only.) . Value        Interpret ation -----        -------------- <20.0        Antibody not detected > or = 20.0  Antibody detected .    Rheumatoid Factor (IgA) <5 U    Comment:                                       Reference Range:                                       <=6  NEGATIVE                                       >6   POSITIVE    Rheumatoid Factor (IgG) <5 U    Comment:                                       Reference Range:                                       <=6  NEGATIVE                                       >6   POSITIVE    Rheumatoid Factor (IgM) <5 U    Comment:                                       Reference Range:                                       <=6 NEGATIVE                                        >6  POSITIVE    Cyclic Citrullin Peptide Ab <83 Units    Comment:                                       Reference Range:  NEGATIVE:                                          <20                                       WEAK POSITIVE:                                          20-39                                       MODERATE POSITIVE:                                          40-59                                       STRONG POSITIVE                                          >59    MUTATED CITRULLINATED VIMENTIN (MCV) AB <20 <20 U/mL    Comment: . Anti-mutated citrullinated vimentin antibody may be used as a second-line marker of rheumatoid arthritis, in addition to rheumatoid factor and anti-cyclic citrullinated peptide (CCP). .    Thyroperoxidase Ab SerPl-aCnc 2 <9 IU/mL  C-reactive protein     Status: None   Collection Time: 05/01/24 11:09 AM  Result Value Ref Range   CRP 5.1 <8.0 mg/L  Sedimentation rate     Status: None   Collection Time: 05/01/24 11:09 AM  Result Value Ref Range   Sed Rate 11 0 - 30 mm/h  Magnesium     Status: None   Collection Time: 05/01/24 11:09 AM  Result Value Ref Range   Magnesium 2.0 1.5 - 2.5 mg/dL  TSH     Status: None   Collection Time: 05/01/24 11:09 AM  Result Value Ref Range   TSH 1.00 mIU/L    Comment:           Reference Range .           > or = 20 Years  0.40-4.50 .                Pregnancy Ranges           First trimester    0.26-2.66           Second trimester   0.55-2.73           Third trimester    0.43-2.91   Ferritin     Status: None   Collection Time: 05/01/24 11:09 AM  Result Value Ref Range   Ferritin 102 16 - 232 ng/mL      Assessment & Plan Conjunctivitis of the right eye Ongoing for ten days with redness, crusting, and drainage. Likely  bacterial conjunctivitis. Artificial tears ineffective. - Prescribed Cipro  ophthalmic drops: two drops every two hours for  24 hours, then one drop every two hours while awake for two days, then one drop every four hours while awake for three days. - Advised warm compresses in the morning, wiping from inside to outside. - Instructed to avoid contact lens use until resolution. - Advised ophthalmology evaluation if no improvement in three days or if symptoms worsen.            [1]  Current Outpatient Medications:    ciprofloxacin  (CILOXAN ) 0.3 % ophthalmic solution, Place 2 drops into both eyes every 2 (two) hours. Administer 1 drop, every 2 hours, while awake, for 2 days. Then 1 drop, every 4 hours, while awake, for the next 5 days., Disp: 5 mL, Rfl: 0   clobetasol  (OLUX ) 0.05 % topical foam, APPLY TOPICALLY TWICE A DAY, Disp: 50 g, Rfl: 1   Fluocinolone  Acetonide Scalp 0.01 % OIL, APPLY TO SCALP DAILY AS NEEDED, Disp: 118.28 mL, Rfl: 1   ketoconazole  (NIZORAL ) 2 % shampoo, Once weekly, lather on scalp, leave on 5 minutes, rinse well., Disp: 120 mL, Rfl: 3   rosuvastatin  (CRESTOR ) 10 MG tablet, Take 1 tablet (10 mg total) by mouth daily., Disp: 90 tablet, Rfl: 1   Selenium  Sulfide 2.3 % SHAM, Apply 5 mLs topically 3 (three) times a week., Disp: 180 mL, Rfl: 2   triamcinolone  cream (KENALOG ) 0.1 %, Apply 1 Application topically 2 (two) times daily., Disp: 453.6 g, Rfl: 0   Vitamin D , Ergocalciferol , (DRISDOL ) 1.25 MG (50000 UNIT) CAPS capsule, TAKE 1 CAPSULE (50,000 UNITS TOTAL) BY MOUTH EVERY 14 DAYS, Disp: 6 capsule, Rfl: 1 [2] No Known Allergies

## 2024-07-08 ENCOUNTER — Encounter

## 2024-07-31 ENCOUNTER — Ambulatory Visit: Admitting: Family Medicine
